# Patient Record
Sex: Male | Born: 1960
Health system: Southern US, Community
[De-identification: ages and names within clinical notes are randomized; demographics above are authoritative.]

## PROBLEM LIST (undated history)

## (undated) DIAGNOSIS — I10 Essential (primary) hypertension: Secondary | ICD-10-CM

## (undated) DIAGNOSIS — G43909 Migraine, unspecified, not intractable, without status migrainosus: Secondary | ICD-10-CM

## (undated) DIAGNOSIS — D72819 Decreased white blood cell count, unspecified: Secondary | ICD-10-CM

## (undated) DIAGNOSIS — D126 Benign neoplasm of colon, unspecified: Secondary | ICD-10-CM

## (undated) DIAGNOSIS — T7840XA Allergy, unspecified, initial encounter: Secondary | ICD-10-CM

## (undated) DIAGNOSIS — K648 Other hemorrhoids: Secondary | ICD-10-CM

## (undated) DIAGNOSIS — E785 Hyperlipidemia, unspecified: Secondary | ICD-10-CM

## (undated) DIAGNOSIS — K5790 Diverticulosis of intestine, part unspecified, without perforation or abscess without bleeding: Secondary | ICD-10-CM

## (undated) DIAGNOSIS — I2510 Atherosclerotic heart disease of native coronary artery without angina pectoris: Secondary | ICD-10-CM

## (undated) HISTORY — DX: Hyperlipidemia, unspecified: E78.5

## (undated) HISTORY — DX: Migraine, unspecified, not intractable, without status migrainosus: G43.909

## (undated) HISTORY — PX: COLONOSCOPY W/ POLYPECTOMY: SHX1380

## (undated) HISTORY — DX: Other hemorrhoids: K64.8

## (undated) HISTORY — PX: OTHER SURGICAL HISTORY: SHX169

## (undated) HISTORY — DX: Decreased white blood cell count, unspecified: D72.819

## (undated) HISTORY — DX: Essential (primary) hypertension: I10

## (undated) HISTORY — DX: Allergy, unspecified, initial encounter: T78.40XA

## (undated) HISTORY — DX: Atherosclerotic heart disease of native coronary artery without angina pectoris: I25.10

## (undated) HISTORY — PX: VASECTOMY: SHX75

## (undated) HISTORY — DX: Benign neoplasm of colon, unspecified: D12.6

## (undated) HISTORY — PX: HERNIA REPAIR: SHX51

## (undated) HISTORY — DX: Diverticulosis of intestine, part unspecified, without perforation or abscess without bleeding: K57.90

---

## 2000-09-01 ENCOUNTER — Encounter: Payer: Self-pay | Admitting: Internal Medicine

## 2000-09-01 ENCOUNTER — Ambulatory Visit (HOSPITAL_COMMUNITY): Admission: RE | Admit: 2000-09-01 | Discharge: 2000-09-01 | Payer: Self-pay | Admitting: Internal Medicine

## 2004-07-03 ENCOUNTER — Emergency Department (HOSPITAL_COMMUNITY): Admission: EM | Admit: 2004-07-03 | Discharge: 2004-07-03 | Payer: Self-pay | Admitting: Emergency Medicine

## 2004-11-27 ENCOUNTER — Ambulatory Visit: Payer: Self-pay | Admitting: Internal Medicine

## 2004-12-07 ENCOUNTER — Ambulatory Visit: Payer: Self-pay | Admitting: Internal Medicine

## 2005-07-19 HISTORY — PX: ANAL FISSURE REPAIR: SHX2312

## 2005-12-23 ENCOUNTER — Ambulatory Visit: Payer: Self-pay | Admitting: Family Medicine

## 2005-12-28 ENCOUNTER — Ambulatory Visit: Payer: Self-pay | Admitting: Internal Medicine

## 2006-01-17 ENCOUNTER — Ambulatory Visit: Payer: Self-pay | Admitting: Internal Medicine

## 2006-04-25 ENCOUNTER — Ambulatory Visit: Payer: Self-pay | Admitting: Internal Medicine

## 2006-06-02 ENCOUNTER — Ambulatory Visit: Payer: Self-pay | Admitting: Internal Medicine

## 2006-07-07 ENCOUNTER — Ambulatory Visit (HOSPITAL_BASED_OUTPATIENT_CLINIC_OR_DEPARTMENT_OTHER): Admission: RE | Admit: 2006-07-07 | Discharge: 2006-07-07 | Payer: Self-pay | Admitting: General Surgery

## 2006-08-02 ENCOUNTER — Ambulatory Visit: Payer: Self-pay | Admitting: Internal Medicine

## 2007-03-03 DIAGNOSIS — R519 Headache, unspecified: Secondary | ICD-10-CM | POA: Insufficient documentation

## 2007-03-03 DIAGNOSIS — R51 Headache: Secondary | ICD-10-CM

## 2007-06-26 ENCOUNTER — Telehealth: Payer: Self-pay | Admitting: Internal Medicine

## 2007-07-12 ENCOUNTER — Ambulatory Visit: Payer: Self-pay | Admitting: Internal Medicine

## 2007-07-12 LAB — CONVERTED CEMR LAB
ALT: 20 units/L (ref 0–53)
AST: 22 units/L (ref 0–37)
Albumin: 3.9 g/dL (ref 3.5–5.2)
Alkaline Phosphatase: 64 units/L (ref 39–117)
BUN: 12 mg/dL (ref 6–23)
Basophils Absolute: 0 10*3/uL (ref 0.0–0.1)
Basophils Relative: 0.7 % (ref 0.0–1.0)
Bilirubin Urine: NEGATIVE
Bilirubin, Direct: 0.2 mg/dL (ref 0.0–0.3)
CO2: 32 meq/L (ref 19–32)
Calcium: 9.4 mg/dL (ref 8.4–10.5)
Chloride: 105 meq/L (ref 96–112)
Cholesterol: 200 mg/dL (ref 0–200)
Creatinine, Ser: 1 mg/dL (ref 0.4–1.5)
Eosinophils Absolute: 0.2 10*3/uL (ref 0.0–0.6)
Eosinophils Relative: 3.4 % (ref 0.0–5.0)
GFR calc Af Amer: 104 mL/min
GFR calc non Af Amer: 86 mL/min
Glucose, Bld: 94 mg/dL (ref 70–99)
HCT: 42.2 % (ref 39.0–52.0)
HDL: 60.8 mg/dL (ref >39.0)
Hemoglobin, Urine: NEGATIVE
Hemoglobin: 14.6 g/dL (ref 13.0–17.0)
Ketones, ur: NEGATIVE mg/dL
LDL Cholesterol: 131 mg/dL — ABNORMAL HIGH (ref 0–99)
Leukocytes, UA: NEGATIVE
Lymphocytes Relative: 16.7 % (ref 12.0–46.0)
MCHC: 34.5 g/dL (ref 30.0–36.0)
MCV: 92.9 fL (ref 78.0–100.0)
Monocytes Absolute: 0.4 10*3/uL (ref 0.2–0.7)
Monocytes Relative: 8.4 % (ref 3.0–11.0)
Neutro Abs: 3.2 10*3/uL (ref 1.4–7.7)
Neutrophils Relative %: 70.8 % (ref 43.0–77.0)
Nitrite: NEGATIVE
PSA: 0.43 ng/mL (ref 0.10–4.00)
Platelets: 188 10*3/uL (ref 150–400)
Potassium: 4.3 meq/L (ref 3.5–5.1)
RBC: 4.55 M/uL (ref 4.22–5.81)
RDW: 12.2 % (ref 11.5–14.6)
Sodium: 142 meq/L (ref 135–145)
Specific Gravity, Urine: 1.02 (ref 1.000–1.03)
TSH: 2.11 microintl units/mL (ref 0.35–5.50)
Total Bilirubin: 0.9 mg/dL (ref 0.3–1.2)
Total CHOL/HDL Ratio: 3.3
Total Protein, Urine: NEGATIVE mg/dL
Total Protein: 7.1 g/dL (ref 6.0–8.3)
Triglycerides: 41 mg/dL (ref 0–149)
Urine Glucose: NEGATIVE mg/dL
Urobilinogen, UA: 0.2 (ref 0.0–1.0)
VLDL: 8 mg/dL (ref 0–40)
WBC: 4.6 10*3/uL (ref 4.5–10.5)
pH: 6 (ref 5.0–8.0)

## 2007-07-28 ENCOUNTER — Ambulatory Visit: Payer: Self-pay | Admitting: Internal Medicine

## 2009-01-16 ENCOUNTER — Ambulatory Visit: Payer: Self-pay | Admitting: Internal Medicine

## 2009-01-16 LAB — CONVERTED CEMR LAB
ALT: 16 units/L (ref 0–53)
AST: 24 units/L (ref 0–37)
Albumin: 3.8 g/dL (ref 3.5–5.2)
Alkaline Phosphatase: 43 units/L (ref 39–117)
BUN: 16 mg/dL (ref 6–23)
Basophils Absolute: 0 10*3/uL (ref 0.0–0.1)
Basophils Relative: 0.5 % (ref 0.0–3.0)
Bilirubin Urine: NEGATIVE
Bilirubin, Direct: 0.1 mg/dL (ref 0.0–0.3)
CO2: 31 meq/L (ref 19–32)
Calcium: 8.8 mg/dL (ref 8.4–10.5)
Chloride: 104 meq/L (ref 96–112)
Cholesterol: 191 mg/dL (ref 0–200)
Creatinine, Ser: 0.9 mg/dL (ref 0.4–1.5)
Eosinophils Absolute: 0.1 10*3/uL (ref 0.0–0.7)
Eosinophils Relative: 3.2 % (ref 0.0–5.0)
GFR calc non Af Amer: 96.06 mL/min (ref >60)
Glucose, Bld: 81 mg/dL (ref 70–99)
HCT: 41.4 % (ref 39.0–52.0)
HDL: 61.5 mg/dL (ref >39.00)
Hemoglobin, Urine: NEGATIVE
Hemoglobin: 13.9 g/dL (ref 13.0–17.0)
Ketones, ur: NEGATIVE mg/dL
LDL Cholesterol: 124 mg/dL — ABNORMAL HIGH (ref 0–99)
Leukocytes, UA: NEGATIVE
Lymphocytes Relative: 24.6 % (ref 12.0–46.0)
Lymphs Abs: 0.8 10*3/uL (ref 0.7–4.0)
MCHC: 33.6 g/dL (ref 30.0–36.0)
MCV: 94.5 fL (ref 78.0–100.0)
Monocytes Absolute: 0.3 10*3/uL (ref 0.1–1.0)
Monocytes Relative: 10.2 % (ref 3.0–12.0)
Neutro Abs: 2 10*3/uL (ref 1.4–7.7)
Neutrophils Relative %: 61.5 % (ref 43.0–77.0)
Nitrite: NEGATIVE
PSA: 0.52 ng/mL (ref 0.10–4.00)
Platelets: 151 10*3/uL (ref 150.0–400.0)
Potassium: 4.1 meq/L (ref 3.5–5.1)
RBC: 4.38 M/uL (ref 4.22–5.81)
RDW: 12.2 % (ref 11.5–14.6)
Sodium: 140 meq/L (ref 135–145)
Specific Gravity, Urine: 1.02 (ref 1.000–1.030)
TSH: 1.78 microintl units/mL (ref 0.35–5.50)
Total Bilirubin: 0.9 mg/dL (ref 0.3–1.2)
Total CHOL/HDL Ratio: 3
Total Protein: 6.4 g/dL (ref 6.0–8.3)
Triglycerides: 26 mg/dL (ref 0.0–149.0)
Urine Glucose: NEGATIVE mg/dL
Urobilinogen, UA: 0.2 (ref 0.0–1.0)
VLDL: 5.2 mg/dL (ref 0.0–40.0)
WBC: 3.2 10*3/uL — ABNORMAL LOW (ref 4.5–10.5)
pH: 6 (ref 5.0–8.0)

## 2009-02-14 ENCOUNTER — Ambulatory Visit: Payer: Self-pay | Admitting: Internal Medicine

## 2010-05-11 ENCOUNTER — Ambulatory Visit: Payer: Self-pay | Admitting: Internal Medicine

## 2010-05-11 LAB — CONVERTED CEMR LAB
ALT: 16 units/L (ref 0–53)
AST: 25 units/L (ref 0–37)
Albumin: 3.9 g/dL (ref 3.5–5.2)
Alkaline Phosphatase: 48 units/L (ref 39–117)
BUN: 14 mg/dL (ref 6–23)
Basophils Absolute: 0 10*3/uL (ref 0.0–0.1)
Basophils Relative: 1 % (ref 0.0–3.0)
Bilirubin Urine: NEGATIVE
Bilirubin, Direct: 0.1 mg/dL (ref 0.0–0.3)
Blood in Urine, dipstick: NEGATIVE
CO2: 29 meq/L (ref 19–32)
Calcium: 8.9 mg/dL (ref 8.4–10.5)
Chloride: 103 meq/L (ref 96–112)
Cholesterol: 205 mg/dL — ABNORMAL HIGH (ref 0–200)
Creatinine, Ser: 1 mg/dL (ref 0.4–1.5)
Direct LDL: 123.5 mg/dL
Eosinophils Absolute: 0.1 10*3/uL (ref 0.0–0.7)
Eosinophils Relative: 3.5 % (ref 0.0–5.0)
GFR calc non Af Amer: 85.58 mL/min (ref >60)
Glucose, Bld: 85 mg/dL (ref 70–99)
Glucose, Urine, Semiquant: NEGATIVE
HCT: 40.9 % (ref 39.0–52.0)
HDL: 60.9 mg/dL (ref >39.00)
Hemoglobin: 13.9 g/dL (ref 13.0–17.0)
Ketones, urine, test strip: NEGATIVE
Lymphocytes Relative: 23.1 % (ref 12.0–46.0)
Lymphs Abs: 0.8 10*3/uL (ref 0.7–4.0)
MCHC: 34 g/dL (ref 30.0–36.0)
MCV: 94.8 fL (ref 78.0–100.0)
Monocytes Absolute: 0.3 10*3/uL (ref 0.1–1.0)
Monocytes Relative: 8.5 % (ref 3.0–12.0)
Neutro Abs: 2.3 10*3/uL (ref 1.4–7.7)
Neutrophils Relative %: 63.9 % (ref 43.0–77.0)
Nitrite: NEGATIVE
Platelets: 159 10*3/uL (ref 150.0–400.0)
Potassium: 4.2 meq/L (ref 3.5–5.1)
Protein, U semiquant: NEGATIVE
RBC: 4.32 M/uL (ref 4.22–5.81)
RDW: 12.5 % (ref 11.5–14.6)
Sodium: 139 meq/L (ref 135–145)
Specific Gravity, Urine: 1.02
TSH: 1.3 microintl units/mL (ref 0.35–5.50)
Total Bilirubin: 0.8 mg/dL (ref 0.3–1.2)
Total CHOL/HDL Ratio: 3
Total Protein: 6.4 g/dL (ref 6.0–8.3)
Triglycerides: 37 mg/dL (ref 0.0–149.0)
Urobilinogen, UA: 0.2
VLDL: 7.4 mg/dL (ref 0.0–40.0)
WBC Urine, dipstick: NEGATIVE
WBC: 3.5 10*3/uL — ABNORMAL LOW (ref 4.5–10.5)
pH: 6.5

## 2010-05-25 ENCOUNTER — Ambulatory Visit: Payer: Self-pay | Admitting: Internal Medicine

## 2010-08-18 NOTE — Assessment & Plan Note (Signed)
Summary: CPX // RS   Vital Signs:  Patient profile:   50 year old male Height:      70 inches Weight:      168 pounds BMI:     24.19 Temp:     98.9 degrees F oral Pulse rate:   72 / minute Pulse rhythm:   regular BP sitting:   140 / 92  (left arm) Cuff size:   regular  Vitals Entered By: Alfred Levins, CMA (May 25, 2010 9:13 AM)  Serial Vital Signs/Assessments:  Time      Position  BP       Pulse  Resp  Temp     By                     135/80                         Birdie Sons MD  CC: cpx   CC:  cpx.  Current Medications (verified): 1)  Claritin-D 12 Hour 5-120 Mg  Tb12 (Loratadine-Pseudoephedrine) 2)  Ginseng 500 Mg  Tabs (Ginseng) .... Once Daily 3)  Garlique 400 Mg Tbec (Garlic) .Marland Kitchen.. 1 Once Daily 4)  Vitamin D 2000 Unit Tabs (Cholecalciferol) .Marland Kitchen.. 1 By Mouth Once Daily  Allergies (verified): No Known Drug Allergies  Social History: Occupation: banker Married Never Smoked Alcohol use-no  Physical Exam  General:  well-developed well-nourished male in no acute distress. HEENT exam atraumatic, normocephalic symmetric muscles are intact. Neck is supple without lymphadenopathy, family, jugular venous distention or carotid bruits. Chest clear to auscultation without increased work of breathing. Cardiac exam S1-S2 are regular PMI is normal. No murmurs or gallops. Abdominal exam thin, to bowel sounds, soft. Extremities there is no clubbing cyanosis or edema pedal pulses are normal. Posterior tibial pulses normal.   Impression & Recommendations:  Problem # 1:  ROUTINE GENERAL MEDICAL EXAM@HEALTH  CARE FACL (ICD-V70.0) health maint UTD patient is in excellent health. He is encouraged to continue his excellent health habits. He exercises regularly.  Complete Medication List: 1)  Claritin-d 12 Hour 5-120 Mg Tb12 (Loratadine-pseudoephedrine) 2)  Ginseng 500 Mg Tabs (Ginseng) .... Once daily 3)  Garlique 400 Mg Tbec (Garlic) .Marland Kitchen.. 1 once daily 4)  Vitamin D 2000 Unit  Tabs (Cholecalciferol) .Marland Kitchen.. 1 by mouth once daily    Orders Added: 1)  Est. Patient 40-64 years [[id number]]

## 2010-12-04 NOTE — Op Note (Signed)
NAME:  Matthew Fox, Matthew Fox NO.:  [id number]   MEDICAL RECORD NO.:  [id number]          PATIENT TYPE:  AMB   LOCATION:  DSC                          FACILITY:  [facility]   PHYSICIAN:  Sharlet Salina T. Hoxworth, M.D.DATE OF BIRTH:  1961-03-03   DATE OF PROCEDURE:  07/07/2006  DATE OF DISCHARGE:                               OPERATIVE REPORT   PREOPERATIVE DIAGNOSIS:  Fistula in ano.   POSTOPERATIVE DIAGNOSIS:  Fistula in ano.   SURGICAL PROCEDURES:  Anal fistulotomy.   SURGEON:  Lorne Skeens. Hoxworth, M.D.   ANESTHESIA:  General.   BRIEF HISTORY:  [name] is a 45-year male who presents with  a several year history of intermittent pressure, pain and perianal  drainage.  More recently, he has had more severe pain with bowel  movements.  Examination in the office has revealed an anal fistula with  an external opening anteriorly at about the 1 o'clock position near the  anal verge.  Also noted was a fairly superficial posterior midline  fissure.  I recommended proceeding with examine under anesthesia with  anal fistulotomy and possible lateral anal sphincterotomy depending on  findings.  The patient was given diltiazem cream preoperatively and  actually has had significant improvement in his more acute pain post  bowel movements.  The nature of procedures, indications, risks of  bleeding, infection, recurrence and degrees of incontinence were  discussed and understood.  The patient was now brought to the operating  room for this procedure.   DESCRIPTION OF OPERATION:  The patient was brought to the operating room  and placed in the supine position on the table, and general laryngeal  mask anesthesia was induced.  He was then carefully positioned in  lithotomy position, the perineum sterilely prepped and draped.  The anus  was gently dilated and then carefully inspected and examined.  There was  a quite small superficial posterior midline fissure.  There did seem  to  be some mild tightness of the internal anal sphincter.  There was a  typical appearing external fistula opening at the 1 o'clock position  about a centimeter and a half of the anal verge.  Initially, I had some  difficulty finding a fistula tract with a probe through the external  opening.  The external opening was excised, and then there was seen to  be a tract.  The probe passed easily to an internal opening in the  anterior midline.  The tissue over the top the probe was then divided  with cautery.  This appeared to diverse a small superficial portion of  the external anal sphincter.  The fistula was completely divided.  Hemostasis was obtained with the cautery.  At this point, as I did have  to divide a small portion  of the external sphincter and the fissure appeared to be healing with  medical management, I elected not to perform an internal sphincterotomy  at this time.  The internal anal sphincter was gently dilated.  The  operative site was packed for hemostasis and dry gauze dressing applied.  The patient taken to the recovery room in  good condition.      Lorne Skeens. Hoxworth, M.D.  Electronically Signed     BTH/MEDQ  D:  07/07/2006  T:  07/07/2006  Job:  [id number]

## 2010-12-04 NOTE — Assessment & Plan Note (Signed)
[facility]                           GASTROENTEROLOGY OFFICE NOTE   NAME:Matthew Fox, Matthew Fox                   MRN:          [id number]  DATE:06/02/2006                            DOB:          03-22-1961    CHIEF COMPLAINT:  Rectal pain.   HISTORY:  [age]-year-old white man that has had rectal pain for about 2  or 3 months.  It seems to be more on the right side of his buttock.  Sometimes he has some pain down the leg, but no back pain.  No numbness and  tingling.  His bowel movements are somewhat soft, but this has not changed;  about once a day he goes to the bathroom.  Again, no rectal bleeding.  Sitting can cause his problems for him when this happens.  He had tried some  hemorrhoidal cream and some suppositories without any benefit, as prescribed  by Dr. Cato Mulligan.  He also has a history of about 5 years ago of having some  intense rectal pain when he had some other illnesses and then one day he  noted a milky discharge in his underwear and the symptoms resolved.  He has  not had any discharge this time.   PAST MEDICAL HISTORY:  Otherwise negative.   MEDICATIONS:  Multivitamin and ginseng daily, Advil p.r.n., which does help  his pain somewhat.   DRUG ALLERGIES:  None known.   FAMILY HISTORY:  A maternal grandfather had colon cancer, no other relatives  with that.   SOCIAL HISTORY:  He is married.  He is a Psychologist, occupational at [employer].  Two  daughters.  Uses some alcohol, but no tobacco or drugs.   REVIEW OF SYSTEMS:  Otherwise negative.  He is very active as a runner,  logging 25-30 miles a week.   PHYSICAL EXAMINATION:  Physical exam reveals a well-developed, well-  nourished middle-aged white man appearing younger than his stated age.  Blood pressure 119/74.  Weight 162 pounds.  Pulse 64.  HEENT:  EOMI.  PERRLA.  Sclerae are anicteric.  Conjunctivae are pink.  NECK:  Supple without thyromegaly, adenopathy or carotid bruits.  CHEST:  Clear to auscultation and percussion without adventitious sounds.  CARDIAC:  Regular rhythm; normal S1, S2.  There are no murmurs, gallops or  rubs.  ABDOMEN:  Soft and nontender.  No organomegaly or mass.  LYMPH NODES:  No groin adenopathy.  SKIN:  Warm and dry without rash.  RECTAL:  Exam does not really show a sentinel pile, but there are some minor  protrusions from the anus consistent with possible old hemorrhoids.  There  is no perianal abscess or fluctuance detected.  He is not really tender on  the perianal exam.  The ischial tuberosities are not tender.  Upon attempt  at rectal exam, he is exquisitely tender in the posterior aspect of the  anus.  Even with 5th digit exam, I cannot really insert my finger due to the  pain.  EXTREMITIES:  Full range of motion.  No cyanosis, clubbing or edema.  RECTAL:  There are no masses.  Stool is Hemoccult-negative.  ASSESSMENT:  This seems most likely an anal fissure.  Abscess is possible,  but I would have expected some drainage.  His bowel movements are soft.  He  does not really describe a lot of pain with defecation, but again, the  clinical scenario is most compatible with a fissure.  I am not sure why he  has had some pain down the leg, then it could just be some radiation from  this pain.  I do not think the has got neuropathic problems like sciatica,  particularly in light of the rectal exam.   PLAN:  1. Diltiazem gel 2% twice daily and before bowel movements.  2. Sitz baths, anal fissure handout given to the patient.  3. Return to see me in 6 weeks.  4. If he is not noting improvement in 2 weeks, he is to call me and I will      arrange for a surgical evaluation.  5. Consider colonoscopy screening, given the family history of colon      cancer, although it is in one 2nd-degree relative.     Iva Boop, MD,FACG  Electronically Signed    CEG/MedQ  DD: 06/02/2006  DT: 06/02/2006  Job #: [id number]   cc:   Valetta Mole.  Swords, MD

## 2011-09-02 ENCOUNTER — Other Ambulatory Visit: Payer: Self-pay | Admitting: Dermatology

## 2011-11-03 ENCOUNTER — Other Ambulatory Visit (INDEPENDENT_AMBULATORY_CARE_PROVIDER_SITE_OTHER): Payer: BC Managed Care – PPO

## 2011-11-03 DIAGNOSIS — Z0000 Encounter for general adult medical examination without abnormal findings: Secondary | ICD-10-CM

## 2011-11-03 LAB — CBC WITH DIFFERENTIAL/PLATELET
Basophils Absolute: 0 10*3/uL (ref 0.0–0.1)
Eosinophils Relative: 3.3 % (ref 0.0–5.0)
HCT: 41.5 % (ref 39.0–52.0)
Hemoglobin: 14 g/dL (ref 13.0–17.0)
Lymphs Abs: 0.8 10*3/uL (ref 0.7–4.0)
MCV: 93.5 fl (ref 78.0–100.0)
Monocytes Absolute: 0.3 10*3/uL (ref 0.1–1.0)
Neutro Abs: 2 10*3/uL (ref 1.4–7.7)
Platelets: 156 10*3/uL (ref 150.0–400.0)
RDW: 13.1 % (ref 11.5–14.6)

## 2011-11-03 LAB — POCT URINALYSIS DIPSTICK
Bilirubin, UA: NEGATIVE
Blood, UA: NEGATIVE
Ketones, UA: NEGATIVE
Leukocytes, UA: NEGATIVE
Protein, UA: NEGATIVE
pH, UA: 7

## 2011-11-03 LAB — BASIC METABOLIC PANEL
BUN: 13 mg/dL (ref 6–23)
CO2: 26 mEq/L (ref 19–32)
Calcium: 9 mg/dL (ref 8.4–10.5)
Chloride: 104 mEq/L (ref 96–112)
Creatinine, Ser: 1 mg/dL (ref 0.4–1.5)
GFR: 86.06 mL/min (ref >60.00)
Glucose, Bld: 94 mg/dL (ref 70–99)
Potassium: 4.2 mEq/L (ref 3.5–5.1)
Sodium: 138 mEq/L (ref 135–145)

## 2011-11-03 LAB — LIPID PANEL
Cholesterol: 204 mg/dL — ABNORMAL HIGH (ref 0–200)
Total CHOL/HDL Ratio: 3
Triglycerides: 29 mg/dL (ref 0.0–149.0)
VLDL: 5.8 mg/dL (ref 0.0–40.0)

## 2011-11-03 LAB — HEPATIC FUNCTION PANEL
ALT: 17 U/L (ref 0–53)
AST: 25 U/L (ref 0–37)
Albumin: 4 g/dL (ref 3.5–5.2)
Alkaline Phosphatase: 46 U/L (ref 39–117)
Bilirubin, Direct: 0.1 mg/dL (ref 0.0–0.3)
Total Bilirubin: 0.7 mg/dL (ref 0.3–1.2)
Total Protein: 6.5 g/dL (ref 6.0–8.3)

## 2011-11-03 LAB — LDL CHOLESTEROL, DIRECT: Direct LDL: 125.5 mg/dL

## 2011-11-30 ENCOUNTER — Ambulatory Visit (INDEPENDENT_AMBULATORY_CARE_PROVIDER_SITE_OTHER): Payer: BC Managed Care – PPO | Admitting: Internal Medicine

## 2011-11-30 ENCOUNTER — Encounter: Payer: Self-pay | Admitting: Internal Medicine

## 2011-11-30 VITALS — BP 130/76 | HR 60 | Temp 98.2°F | Ht 70.0 in | Wt 164.0 lb

## 2011-11-30 DIAGNOSIS — Z0000 Encounter for general adult medical examination without abnormal findings: Secondary | ICD-10-CM

## 2011-11-30 NOTE — Progress Notes (Signed)
Patient ID: Matthew Fox, male   DOB: Apr 27, 1961, 51 y.o.   MRN: [id number] CPX  Past Medical History  Diagnosis Date  . Thrombocytopenia   . Hyperlipidemia   . Headache     History   Social History  . Marital Status: Married    Spouse Name: N/A    Number of Children: N/A  . Years of Education: N/A   Occupational History  . Not on file.   Social History Main Topics  . Smoking status: Never Smoker   . Smokeless tobacco: Not on file  . Alcohol Use: No  . Drug Use: No  . Sexually Active: Not on file   Other Topics Concern  . Not on file   Social History Narrative  . No narrative on file    Past Surgical History  Procedure Date  . Hernia repair     Family History  Problem Relation Age of Onset  . Cancer Mother     lung  . Heart failure Father   . Cancer Maternal Grandfather     colon    No Known Allergies  No current outpatient prescriptions on file prior to visit.     patient denies chest pain, shortness of breath, orthopnea. Denies lower extremity edema, abdominal pain, change in appetite, change in bowel movements. Patient denies rashes, musculoskeletal complaints. No other specific complaints in a complete review of systems.   BP 130/92  Pulse 60  Temp(Src) 98.2 F (36.8 C) (Oral)  Ht 5\' 10"  (1.778 m)  Wt 164 lb (74.39 kg)  BMI 23.53 kg/m2 Well-developed male in no acute distress. HEENT exam atraumatic, normocephalic, extraocular muscles are intact. Conjunctivae are pink without exudate. Neck is supple without lymphadenopathy, thyromegaly, jugular venous distention. Chest is clear to auscultation without increased work of breathing. Cardiac exam S1-S2 are regular. The PMI is normal. No significant murmurs or gallops. Abdominal exam active bowel sounds, soft, nontender. No abdominal bruits. Extremities no clubbing cyanosis or edema. Peripheral pulses are normal without bruits. Neurologic exam alert and oriented without any motor or sensory deficits.  Rectal exam normal tone prostate normal size without masses or asymmetry.   Well Visit: health maintenance up to date.

## 2012-02-04 ENCOUNTER — Encounter: Payer: Self-pay | Admitting: Gastroenterology

## 2012-02-29 ENCOUNTER — Encounter: Payer: Self-pay | Admitting: Internal Medicine

## 2012-04-17 ENCOUNTER — Ambulatory Visit (AMBULATORY_SURGERY_CENTER): Payer: BC Managed Care – PPO | Admitting: *Deleted

## 2012-04-17 ENCOUNTER — Encounter: Payer: Self-pay | Admitting: Internal Medicine

## 2012-04-17 VITALS — Ht 70.0 in | Wt 165.0 lb

## 2012-04-17 DIAGNOSIS — Z1211 Encounter for screening for malignant neoplasm of colon: Secondary | ICD-10-CM

## 2012-04-17 MED ORDER — NA SULFATE-K SULFATE-MG SULF 17.5-3.13-1.6 GM/177ML PO SOLN: ORAL | Status: DC

## 2012-05-01 ENCOUNTER — Ambulatory Visit (AMBULATORY_SURGERY_CENTER): Payer: BC Managed Care – PPO | Admitting: Internal Medicine

## 2012-05-01 ENCOUNTER — Encounter: Payer: Self-pay | Admitting: Internal Medicine

## 2012-05-01 VITALS — BP 112/63 | HR 51 | Temp 96.1°F | Resp 21 | Ht 70.0 in | Wt 165.0 lb

## 2012-05-01 DIAGNOSIS — D126 Benign neoplasm of colon, unspecified: Secondary | ICD-10-CM

## 2012-05-01 DIAGNOSIS — Z1211 Encounter for screening for malignant neoplasm of colon: Secondary | ICD-10-CM

## 2012-05-01 MED ORDER — SODIUM CHLORIDE 0.9 % IV SOLN: 500.0000 mL | INTRAVENOUS | Status: DC

## 2012-05-01 NOTE — Progress Notes (Addendum)
Patient did not have preoperative order for IV antibiotic SSI prophylaxis. (G8918)  Patient did not experience any of the following events: a burn prior to discharge; a fall within the facility; wrong site/side/patient/procedure/implant event; or a hospital transfer or hospital admission upon discharge from the facility. (G8907)  

## 2012-05-01 NOTE — Progress Notes (Signed)
The pt tolerated the colonoscopy very well. Maw   

## 2012-05-01 NOTE — Patient Instructions (Addendum)
YOU HAD AN ENDOSCOPIC PROCEDURE TODAY AT THE [facility]: Refer to the procedure report that was given to you for any specific questions about what was found during the examination.  If the procedure report does not answer your questions, please call your gastroenterologist to clarify.  If you requested that your care partner not be given the details of your procedure findings, then the procedure report has been included in a sealed envelope for you to review at your convenience later.  YOU SHOULD EXPECT: Some feelings of bloating in the abdomen. Passage of more gas than usual.  Walking can help get rid of the air that was put into your GI tract during the procedure and reduce the bloating. If you had a lower endoscopy (such as a colonoscopy or flexible sigmoidoscopy) you may notice spotting of blood in your stool or on the toilet paper. If you underwent a bowel prep for your procedure, then you may not have a normal bowel movement for a few days.  DIET: Your first meal following the procedure should be a light meal and then it is ok to progress to your normal diet.  A half-sandwich or bowl of soup is an example of a good first meal.  Heavy or fried foods are harder to digest and may make you feel nauseous or bloated.  Likewise meals heavy in dairy and vegetables can cause extra gas to form and this can also increase the bloating.  Drink plenty of fluids but you should avoid alcoholic beverages for 24 hours.  ACTIVITY: Your care partner should take you home directly after the procedure.  You should plan to take it easy, moving slowly for the rest of the day.  You can resume normal activity the day after the procedure however you should NOT DRIVE or use heavy machinery for 24 hours (because of the sedation medicines used during the test).    SYMPTOMS TO REPORT IMMEDIATELY: A gastroenterologist can be reached at any hour.  During normal business hours, 8:30 AM to 5:00 PM Monday through Friday,  call [phone].  After hours and on weekends, please call the GI answering service at [phone] who will take a message and have the physician on call contact you.   Following lower endoscopy (colonoscopy or flexible sigmoidoscopy):  Excessive amounts of blood in the stool  Significant tenderness or worsening of abdominal pains  Swelling of the abdomen that is new, acute  Fever of 100F or higher  FOLLOW UP: If any biopsies were taken you will be contacted by phone or by letter within the next 1-3 weeks.  Call your gastroenterologist if you have not heard about the biopsies in 3 weeks.  Our staff will call the home number listed on your records the next business day following your procedure to check on you and address any questions or concerns that you may have at that time regarding the information given to you following your procedure. This is a courtesy call and so if there is no answer at the home number and we have not heard from you through the emergency physician on call, we will assume that you have returned to your regular daily activities without incident.  SIGNATURES/CONFIDENTIALITY: You and/or your care partner have signed paperwork which will be entered into your electronic medical record.  These signatures attest to the fact that that the information above on your After Visit Summary has been reviewed and is understood.  Full responsibility of the confidentiality of this   discharge information lies with you and/or your care-partner.   Thank-you for choosing us for your medical needs. 

## 2012-05-01 NOTE — Op Note (Signed)
[facility] [address] [place], [address]   COLONOSCOPY PROCEDURE REPORT  PATIENT: Matthew Fox, Matthew Fox  MR#: [id number] BIRTHDATE: [birth year] , 51  yrs. old GENDER: Male ENDOSCOPIST: Beverley Fiedler, MD REFERRED XB:JYNWGN, Bruce PROCEDURE DATE:  05/01/2012 PROCEDURE:   Colonoscopy with snare polypectomy ASA CLASS:   Class I INDICATIONS:average risk screening and first colonoscopy. MEDICATIONS: MAC sedation, administered by CRNA and Propofol (Diprivan) 370 mg IV  DESCRIPTION OF PROCEDURE:   After the risks benefits and alternatives of the procedure were thoroughly explained, informed consent was obtained.  A digital rectal exam revealed no rectal mass.   The     endoscope was introduced through the anus and advanced to the cecum, which was identified by both the appendix and ileocecal valve. No adverse events experienced.   The quality of the prep was Suprep fair  The instrument was then slowly withdrawn as the colon was fully examined.   COLON FINDINGS: Two sessile polyps measuring 4-6 mm in size were found in the ascending colon and sigmoid colon.  Polypectomy was performed using cold snare.  All resections were complete and all polyp tissue was completely retrieved.   Mild diverticulosis was noted in the ascending colon and sigmoid colon.  Retroflexed views revealed no abnormalities. The time to cecum=4 minutes 48 seconds. Withdrawal time=15 minutes 20 seconds.  The scope was withdrawn and the procedure completed. COMPLICATIONS: There were no complications.  ENDOSCOPIC IMPRESSION: 1.   Two sessile polyps measuring 4-6 mm in size were found in the ascending colon and sigmoid colon; Polypectomy was performed using cold snare 2.   Mild diverticulosis was noted in the ascending colon and sigmoid colon  RECOMMENDATIONS: 1.  Await pathology results 2.  If the polyps removed today are proven to be adenomatous (pre-cancerous) polyps, you will need a repeat  colonoscopy in 5 years.  Otherwise you should continue to follow colorectal cancer screening guidelines for "routine risk" patients with colonoscopy in 10 years.  You will receive a letter within 1-2 weeks with the results of your biopsy as well as final recommendations.  Please call my office if you have not received a letter after 3 weeks.   eSigned:  Beverley Fiedler, MD 05/01/2012 8:48 AM   cc: Lindley Magnus, MD and The Patient

## 2012-05-02 ENCOUNTER — Telehealth: Payer: Self-pay | Admitting: *Deleted

## 2012-05-02 NOTE — Telephone Encounter (Signed)
No answer, identifier. Left message to call if questions or concerns.

## 2012-05-02 NOTE — Telephone Encounter (Deleted)
  Follow up Call-  Call back number 05/01/2012  Post procedure Call Back phone  # [phone]  Permission to leave phone message Yes     Patient questions:  Do you have a fever, pain , or abdominal swelling? {yes no:314532} Pain Score  {NUMBERS; 0-10:5044} *  Have you tolerated food without any problems? {yes no:314532}  Have you been able to return to your normal activities? {yes no:314532}  Do you have any questions about your discharge instructions: Diet   {yes no:314532} Medications  {yes no:314532} Follow up visit  {yes no:314532}  Do you have questions or concerns about your Care? {yes no:314532}  Actions: * If pain score is 4 or above: {ACTION; [facility] ENDO PAIN >4:21563::"No action needed, pain <4."}

## 2012-05-08 ENCOUNTER — Encounter: Payer: Self-pay | Admitting: Internal Medicine

## 2013-05-23 ENCOUNTER — Other Ambulatory Visit (INDEPENDENT_AMBULATORY_CARE_PROVIDER_SITE_OTHER): Payer: BC Managed Care – PPO

## 2013-05-23 DIAGNOSIS — Z0000 Encounter for general adult medical examination without abnormal findings: Secondary | ICD-10-CM

## 2013-05-23 LAB — HEPATIC FUNCTION PANEL
ALT: 20 U/L (ref 0–53)
Albumin: 4 g/dL (ref 3.5–5.2)
Bilirubin, Direct: 0.1 mg/dL (ref 0.0–0.3)
Total Bilirubin: 0.8 mg/dL (ref 0.3–1.2)

## 2013-05-23 LAB — CBC WITH DIFFERENTIAL/PLATELET
Basophils Absolute: 0 10*3/uL (ref 0.0–0.1)
Basophils Relative: 0.8 % (ref 0.0–3.0)
Eosinophils Absolute: 0.1 10*3/uL (ref 0.0–0.7)
MCHC: 34.9 g/dL (ref 30.0–36.0)
MCV: 91 fl (ref 78.0–100.0)
Monocytes Absolute: 0.3 10*3/uL (ref 0.1–1.0)
Neutrophils Relative %: 73.9 % (ref 43.0–77.0)
Platelets: 161 10*3/uL (ref 150.0–400.0)
RBC: 4.36 Mil/uL (ref 4.22–5.81)
RDW: 13.4 % (ref 11.5–14.6)

## 2013-05-23 LAB — POCT URINALYSIS DIPSTICK
Bilirubin, UA: NEGATIVE
Ketones, UA: NEGATIVE
Leukocytes, UA: NEGATIVE
pH, UA: 6.5

## 2013-05-23 LAB — BASIC METABOLIC PANEL
BUN: 14 mg/dL (ref 6–23)
CO2: 27 mEq/L (ref 19–32)
Chloride: 106 mEq/L (ref 96–112)
Creatinine, Ser: 0.9 mg/dL (ref 0.4–1.5)
Glucose, Bld: 91 mg/dL (ref 70–99)

## 2013-05-23 LAB — TSH: TSH: 1.97 u[IU]/mL (ref 0.35–5.50)

## 2013-05-23 LAB — PSA: PSA: 0.45 ng/mL (ref 0.10–4.00)

## 2013-05-23 LAB — LIPID PANEL: Triglycerides: 28 mg/dL (ref 0.0–149.0)

## 2013-05-23 LAB — LDL CHOLESTEROL, DIRECT: Direct LDL: 132.8 mg/dL

## 2013-05-30 ENCOUNTER — Encounter: Payer: BC Managed Care – PPO | Admitting: Internal Medicine

## 2013-06-12 ENCOUNTER — Encounter: Payer: Self-pay | Admitting: Internal Medicine

## 2013-06-12 ENCOUNTER — Ambulatory Visit (INDEPENDENT_AMBULATORY_CARE_PROVIDER_SITE_OTHER): Payer: BC Managed Care – PPO | Admitting: Internal Medicine

## 2013-06-12 VITALS — BP 116/78 | HR 60 | Temp 98.0°F | Ht 70.0 in | Wt 165.0 lb

## 2013-06-12 DIAGNOSIS — Z0000 Encounter for general adult medical examination without abnormal findings: Secondary | ICD-10-CM

## 2013-06-12 MED ORDER — ELETRIPTAN HYDROBROMIDE 40 MG PO TABS: 40.0000 mg | ORAL_TABLET | ORAL | Status: DC | PRN

## 2013-06-12 NOTE — Progress Notes (Signed)
cpx  Past Medical History  Diagnosis Date  . Thrombocytopenia   . Hyperlipidemia   . Headache   . Allergy     History   Social History  . Marital Status: Married    Spouse Name: N/A    Number of Children: N/A  . Years of Education: N/A   Occupational History  . Not on file.   Social History Main Topics  . Smoking status: Never Smoker   . Smokeless tobacco: Never Used  . Alcohol Use: No  . Drug Use: No  . Sexual Activity: Not on file   Other Topics Concern  . Not on file   Social History Narrative  . No narrative on file    Past Surgical History  Procedure Laterality Date  . Hernia repair      as infant; bilateral  . Anal fissure repair  2007    Family History  Problem Relation Age of Onset  . Cancer Mother     lung  . Heart failure Father   . Cancer Maternal Grandfather     colon  . Colon cancer Maternal Grandfather 27  . Stomach cancer Neg Hx     No Known Allergies  Current Outpatient Prescriptions on File Prior to Visit  Medication Sig Dispense Refill  . Ginseng 500 MG TABS Take 1 tablet by mouth daily.      Marland Kitchen loratadine-pseudoephedrine (CLARITIN-D 12-HOUR) 5-120 MG per tablet Take 1 tablet by mouth daily.       No current facility-administered medications on file prior to visit.     patient denies chest pain, shortness of breath, orthopnea. Denies lower extremity edema, abdominal pain, change in appetite, change in bowel movements. Patient denies rashes, musculoskeletal complaints. No other specific complaints in a complete review of systems. - Headache- typically happens at the end of the week. Ongoing for years. Can be unilateral , can be associated with nausea. Occasionally can be bilateral  Reviewed vitals Well-developed male in no acute distress. HEENT exam atraumatic, normocephalic, extraocular muscles are intact. Conjunctivae are pink without exudate. Neck is supple without lymphadenopathy, thyromegaly, jugular venous distention. Chest is  clear to auscultation without increased work of breathing. Cardiac exam S1-S2 are regular. The PMI is normal. No significant murmurs or gallops. Abdominal exam active bowel sounds, soft, nontender. No abdominal bruits. Extremities no clubbing cyanosis or edema. Peripheral pulses are normal without bruits. Neurologic exam alert and oriented without any motor or sensory deficits. Rectal exam normal tone prostate normal size without masses or asymmetry.   Well Visit- health maint UTD

## 2013-06-12 NOTE — Progress Notes (Signed)
Pre visit review using our clinic review tool, if applicable. No additional management support is needed unless otherwise documented below in the visit note. 

## 2013-06-15 ENCOUNTER — Telehealth: Payer: Self-pay | Admitting: Internal Medicine

## 2013-06-15 MED ORDER — ELETRIPTAN HYDROBROMIDE 40 MG PO TABS: 40.0000 mg | ORAL_TABLET | ORAL | Status: DC | PRN

## 2013-06-15 NOTE — Telephone Encounter (Signed)
Left detailed message of personal voicemail Rx sent to pharmacy as requested.

## 2013-06-15 NOTE — Telephone Encounter (Signed)
Pt wants to Dr. Cato Mulligan to know that the relpax does work and he is very happy.  Please call in a new rx of this medication to [facility]. Please advise.

## 2013-06-15 NOTE — Telephone Encounter (Signed)
FYI Dr. Cato Mulligan pt wanted you to know the Relpax worked and I sent Rx to pharmacy.

## 2013-06-20 ENCOUNTER — Telehealth: Payer: Self-pay | Admitting: Internal Medicine

## 2013-06-20 MED ORDER — ELETRIPTAN HYDROBROMIDE 40 MG PO TABS: ORAL_TABLET | ORAL | Status: DC

## 2013-06-20 NOTE — Telephone Encounter (Signed)
Pt states he was supposed to have rx for eletriptan (RELPAX) 40 MG tablet sent to the pharmacy last week.  Pt just left pharmacy and was told no rx was sent in.  Please review and follow up with pt.

## 2013-06-20 NOTE — Telephone Encounter (Signed)
Reviewed chart and rx was printed by accident.  Resubmitted electronically

## 2013-09-28 ENCOUNTER — Other Ambulatory Visit: Payer: Self-pay | Admitting: Internal Medicine

## 2014-01-17 ENCOUNTER — Other Ambulatory Visit: Payer: Self-pay | Admitting: Internal Medicine

## 2014-04-01 ENCOUNTER — Other Ambulatory Visit: Payer: Self-pay | Admitting: Internal Medicine

## 2014-07-06 ENCOUNTER — Other Ambulatory Visit: Payer: Self-pay | Admitting: Internal Medicine

## 2014-11-12 ENCOUNTER — Ambulatory Visit (INDEPENDENT_AMBULATORY_CARE_PROVIDER_SITE_OTHER): Payer: BLUE CROSS/BLUE SHIELD | Admitting: Family Medicine

## 2014-11-12 ENCOUNTER — Encounter: Payer: Self-pay | Admitting: Family Medicine

## 2014-11-12 VITALS — BP 112/80 | HR 66 | Temp 98.3°F | Ht 72.0 in | Wt 166.0 lb

## 2014-11-12 DIAGNOSIS — D72819 Decreased white blood cell count, unspecified: Secondary | ICD-10-CM | POA: Insufficient documentation

## 2014-11-12 DIAGNOSIS — G43909 Migraine, unspecified, not intractable, without status migrainosus: Secondary | ICD-10-CM

## 2014-11-12 DIAGNOSIS — D126 Benign neoplasm of colon, unspecified: Secondary | ICD-10-CM | POA: Insufficient documentation

## 2014-11-12 DIAGNOSIS — E785 Hyperlipidemia, unspecified: Secondary | ICD-10-CM | POA: Insufficient documentation

## 2014-11-12 NOTE — Progress Notes (Signed)
Matthew Reddish, MD Phone: [phone]  Subjective:  Patient presents today to establish care with me as their new primary care provider. Patient was formerly a patient of Dr. Leanne Chang. Chief complaint-noted.   Migraine- no prophylaxis, reasonable control with abortive therapy -since teenage years. Usually towards the end of the week. Any # of triggers-food, red wine, fatigue, allergies flared up so taking claritin D 3x a week. Severe in right temple. Can usually get rid of them with tylenol but has had to take up to 12 a day in past. Relpax Also relieves headaches. Nausea with episodes. Pounding. Not light or sound sensitive. Averages around once a month. Does better when he is exercising which he generally does a good job of-Running 15 miles a week and biking road some.  ROS- denies aura, blurry vision, increasing frequency or intensity of headaches.   Hyperlipidemia- mild poor control. 2.7% 10 year risk  Lab Results  Component Value Date   LDLCALC 124* 01/16/2009   On statin: no Regular exercise: yes, avid runner Diet: very reasonable ROS- no chest pain or shortness of breath. No myalgias  The following were reviewed and entered/updated in epic: Past Medical History  Diagnosis Date  . Leukopenia     stable in the 3s  . Hyperlipidemia     no rx. total just above 200, HDL great, LDL in 120s 130s  . Migraine     relpax about once a month  . Allergy   . Adenomatous colon polyp     2013, 5 year repeat   Patient Active Problem List   Diagnosis Date Noted  . Migraine     Priority: Medium  . Hyperlipidemia     Priority: Medium  . Adenomatous colon polyp     Priority: Low  . Leukopenia     Priority: Low   Past Surgical History  Procedure Laterality Date  . Hernia repair      as infant; bilateral  . Anal fissure repair  2007    Family History  Problem Relation Age of Onset  . Lung cancer Mother     smoker, died 77 age 55  . Heart failure Father     rheumatic  fever-died 23 months after mother, did not take care of himself  . Colon cancer Maternal Grandfather     early 16s  . Stomach cancer Neg Hx     Medications- reviewed and updated Current Outpatient Prescriptions  Medication Sig Dispense Refill  . Ginseng 500 MG TABS Take 1 tablet by mouth daily.    Marland Kitchen loratadine-pseudoephedrine (CLARITIN-D 12-HOUR) 5-120 MG per tablet Take 1 tablet by mouth daily.    . RELPAX 40 MG tablet TAKE 1 TABLET BY MOUTH AT ONSET OF HEADACHE( MAY REPEAT IN 2 HOURS IF HEADACHE STILL PERSISTS) 10 tablet 3   No current facility-administered medications for this visit.    Allergies-reviewed and updated No Known Allergies  History   Social History  . Marital Status: Married    Spouse Name: N/A  . Number of Children: N/A  . Years of Education: N/A   Social History Main Topics  . Smoking status: Never Smoker   . Smokeless tobacco: Never Used  . Alcohol Use: 4.2 oz/week    7 Standard drinks or equivalent per week  . Drug Use: No  . Sexual Activity: Not on file   Other Topics Concern  . None   Social History Narrative   Married (wife patient of Dr. Yong Channel), 2 daughters Janett Billow 79 NP  in [place] with oncology and Lilia Pro 20 sophomore at [employer] going into Business.       Works at [employer]: running, biking, golf    ROS--See HPI   Objective: BP 112/80 mmHg  Pulse 66  Temp(Src) 98.3 F (36.8 C)  Ht 6' (1.829 m)  Wt 166 lb (75.297 kg)  BMI 22.51 kg/m2 Gen: NAD, resting comfortably HEENT: Mucous membranes are moist. Oropharynx normal Neck: no thyromegaly CV: RRR no murmurs rubs or gallops Lungs: CTAB no crackles, wheeze, rhonchi Abdomen: soft/nontender/nondistended/normal bowel sounds. No rebound or guarding.  Ext: no edema, 2+ PT pulses Skin: warm, dry, no rash Neuro: grossly normal, moves all extremities, PERRLA   Assessment/Plan:  Migraine Doing well on relpax but insurance no longer covers. 2 remaining samples  we had were provided to patient. We will research to see if any triptans covered other than relpax.    Hyperlipidemia 2.7% 10 year risk today using 2014 labs and today's vitals. Patient will return for physical this year and we will update but I suspect patient risks will not outweigh benefits for statin. Continue regular running, healthy eating.    6-12 months CPE

## 2014-11-12 NOTE — Assessment & Plan Note (Signed)
2.7% 10 year risk today using 2014 labs and today's vitals. Patient will return for physical this year and we will update but I suspect patient risks will not outweigh benefits for statin. Continue regular running, healthy eating.

## 2014-11-12 NOTE — Patient Instructions (Signed)
We will try to reach out to your insurance and see which options are covered for migraine treatment. Provided 2 samples. If you dont hear from [place] in a month, give [place] a call so we can update your status.   Follow up at your convenience for annual physical with labs a few days to a week before including PSAh

## 2014-11-12 NOTE — Progress Notes (Signed)
Pre visit review using our clinic review tool, if applicable. No additional management support is needed unless otherwise documented below in the visit note. 

## 2014-11-12 NOTE — Assessment & Plan Note (Signed)
Doing well on relpax but insurance no longer covers. 2 remaining samples we had were provided to patient. We will research to see if any triptans covered other than relpax.

## 2014-11-13 ENCOUNTER — Telehealth: Payer: Self-pay | Admitting: Family Medicine

## 2014-11-13 MED ORDER — SUMATRIPTAN SUCCINATE 50 MG PO TABS: 50.0000 mg | ORAL_TABLET | ORAL | Status: DC | PRN

## 2014-11-13 NOTE — Progress Notes (Signed)
I left a print out from [employer]'s formulary for you to review.

## 2014-11-13 NOTE — Telephone Encounter (Signed)
I received a copy of formulary which states relpax 40mg  is tier 2 so not clear why it is not covered.   Says sumatriptan (imitrex)is tier 1 so we will trial that medication.   Please ask patient to stop by or call [facility] to ask for price before filling. On epic, says may need prior auth, so not clear what steps we would need to use if this not covered. Will CC Latonya to keep her in the look if we hear back that not covered

## 2014-11-14 NOTE — Telephone Encounter (Signed)
Thank you, noted.

## 2014-11-14 NOTE — Telephone Encounter (Signed)
I checked with the pharmacy and sumatriptan did not require a PA. It is ready for patient for pick up.

## 2015-02-14 ENCOUNTER — Other Ambulatory Visit (INDEPENDENT_AMBULATORY_CARE_PROVIDER_SITE_OTHER): Payer: BLUE CROSS/BLUE SHIELD

## 2015-02-14 DIAGNOSIS — Z0000 Encounter for general adult medical examination without abnormal findings: Secondary | ICD-10-CM

## 2015-02-14 LAB — BASIC METABOLIC PANEL
BUN: 17 mg/dL (ref 6–23)
CO2: 30 mEq/L (ref 19–32)
Calcium: 9.1 mg/dL (ref 8.4–10.5)
Chloride: 105 mEq/L (ref 96–112)
Creatinine, Ser: 1.2 mg/dL (ref 0.40–1.50)
GFR: 67.25 mL/min (ref >60.00)
Glucose, Bld: 90 mg/dL (ref 70–99)
POTASSIUM: 4.2 meq/L (ref 3.5–5.1)
SODIUM: 142 meq/L (ref 135–145)

## 2015-02-14 LAB — HEPATIC FUNCTION PANEL
ALBUMIN: 4.2 g/dL (ref 3.5–5.2)
ALT: 15 U/L (ref 0–53)
AST: 20 U/L (ref 0–37)
Alkaline Phosphatase: 57 U/L (ref 39–117)
Bilirubin, Direct: 0.1 mg/dL (ref 0.0–0.3)
TOTAL PROTEIN: 6.6 g/dL (ref 6.0–8.3)
Total Bilirubin: 0.5 mg/dL (ref 0.2–1.2)

## 2015-02-14 LAB — POCT URINALYSIS DIPSTICK
Bilirubin, UA: NEGATIVE
Blood, UA: NEGATIVE
Glucose, UA: NEGATIVE
Ketones, UA: NEGATIVE
Leukocytes, UA: NEGATIVE
NITRITE UA: NEGATIVE
PH UA: 6.5
PROTEIN UA: NEGATIVE
SPEC GRAV UA: 1.01
Urobilinogen, UA: 0.2

## 2015-02-14 LAB — CBC WITH DIFFERENTIAL/PLATELET
BASOS ABS: 0 10*3/uL (ref 0.0–0.1)
Basophils Relative: 0.6 % (ref 0.0–3.0)
EOS ABS: 0.1 10*3/uL (ref 0.0–0.7)
Eosinophils Relative: 2.2 % (ref 0.0–5.0)
HCT: 39.9 % (ref 39.0–52.0)
HEMOGLOBIN: 13.8 g/dL (ref 13.0–17.0)
LYMPHS ABS: 0.5 10*3/uL — AB (ref 0.7–4.0)
Lymphocytes Relative: 15.2 % (ref 12.0–46.0)
MCHC: 34.5 g/dL (ref 30.0–36.0)
MCV: 92.5 fl (ref 78.0–100.0)
MONO ABS: 0.3 10*3/uL (ref 0.1–1.0)
Monocytes Relative: 7.2 % (ref 3.0–12.0)
NEUTROS ABS: 2.7 10*3/uL (ref 1.4–7.7)
Neutrophils Relative %: 74.8 % (ref 43.0–77.0)
Platelets: 180 10*3/uL (ref 150.0–400.0)
RBC: 4.31 Mil/uL (ref 4.22–5.81)
RDW: 13.2 % (ref 11.5–15.5)
WBC: 3.6 10*3/uL — ABNORMAL LOW (ref 4.0–10.5)

## 2015-02-14 LAB — LIPID PANEL
CHOL/HDL RATIO: 3
CHOLESTEROL: 193 mg/dL (ref 0–200)
HDL: 64.5 mg/dL (ref >39.00)
LDL Cholesterol: 120 mg/dL — ABNORMAL HIGH (ref 0–99)
NonHDL: 128.64
TRIGLYCERIDES: 42 mg/dL (ref 0.0–149.0)
VLDL: 8.4 mg/dL (ref 0.0–40.0)

## 2015-02-14 LAB — PSA: PSA: 0.51 ng/mL (ref 0.10–4.00)

## 2015-02-14 LAB — TSH: TSH: 2.58 u[IU]/mL (ref 0.35–4.50)

## 2015-02-21 ENCOUNTER — Encounter: Payer: BLUE CROSS/BLUE SHIELD | Admitting: Family Medicine

## 2015-02-27 ENCOUNTER — Encounter: Payer: BLUE CROSS/BLUE SHIELD | Admitting: Family Medicine

## 2015-05-08 ENCOUNTER — Ambulatory Visit: Payer: BLUE CROSS/BLUE SHIELD | Admitting: *Deleted

## 2015-05-09 ENCOUNTER — Encounter: Payer: Self-pay | Admitting: *Deleted

## 2015-05-09 ENCOUNTER — Ambulatory Visit (INDEPENDENT_AMBULATORY_CARE_PROVIDER_SITE_OTHER): Payer: BLUE CROSS/BLUE SHIELD | Admitting: *Deleted

## 2015-05-09 DIAGNOSIS — Z23 Encounter for immunization: Secondary | ICD-10-CM

## 2015-09-19 ENCOUNTER — Ambulatory Visit (INDEPENDENT_AMBULATORY_CARE_PROVIDER_SITE_OTHER): Payer: BLUE CROSS/BLUE SHIELD | Admitting: Family Medicine

## 2015-09-19 ENCOUNTER — Encounter: Payer: Self-pay | Admitting: Family Medicine

## 2015-09-19 VITALS — BP 110/70 | HR 68 | Temp 99.2°F | Wt 163.0 lb

## 2015-09-19 DIAGNOSIS — J111 Influenza due to unidentified influenza virus with other respiratory manifestations: Secondary | ICD-10-CM

## 2015-09-19 DIAGNOSIS — R6883 Chills (without fever): Secondary | ICD-10-CM

## 2015-09-19 LAB — POCT INFLUENZA A/B
INFLUENZA A, POC: POSITIVE — AB
INFLUENZA B, POC: POSITIVE — AB

## 2015-09-19 MED ORDER — OSELTAMIVIR PHOSPHATE 75 MG PO CAPS: 75.0000 mg | ORAL_CAPSULE | Freq: Two times a day (BID) | ORAL | Status: DC

## 2015-09-19 NOTE — Patient Instructions (Signed)
Treat with tamiflu for 5 days.  You are likely contagious a week from your first fever  Influenza, Adult Influenza ("the flu") is a viral infection of the respiratory tract. It occurs more often in winter months because people spend more time in close contact with one another. Influenza can make you feel very sick. Influenza easily spreads from person to person (contagious). CAUSES  Influenza is caused by a virus that infects the respiratory tract. You can catch the virus by breathing in droplets from an infected person's cough or sneeze. You can also catch the virus by touching something that was recently contaminated with the virus and then touching your mouth, nose, or eyes. RISKS AND COMPLICATIONS You may be at risk for a more severe case of influenza if you smoke cigarettes, have diabetes, have chronic heart disease (such as heart failure) or lung disease (such as asthma), or if you have a weakened immune system. Elderly people and pregnant women are also at risk for more serious infections. The most common problem of influenza is a lung infection (pneumonia). Sometimes, this problem can require emergency medical care and may be life threatening. SIGNS AND SYMPTOMS  Symptoms typically last 7 to 14 days and may include:  Fever.  Chills.  Headache, body aches, and muscle aches.  Sore throat.  Chest discomfort and cough.  Poor appetite.  Weakness or feeling tired.  Dizziness.  Nausea or vomiting. DIAGNOSIS  Diagnosis of influenza is often made based on your history and a physical exam. A nose or throat swab test can be done to confirm the diagnosis. TREATMENT  In mild cases, influenza goes away on its own. Treatment is directed at relieving symptoms. For more severe cases, your health care provider may prescribe antiviral medicines to shorten the sickness. Antibiotic medicines are not effective because the infection is caused by a virus, not by bacteria. HOME CARE  INSTRUCTIONS  Take medicines only as directed by your health care provider.  Use a cool mist humidifier to make breathing easier.  Get plenty of rest until your temperature returns to normal. This usually takes 3 to 4 days.  Drink enough fluid to keep your urine clear or pale yellow.  Cover yourmouth and nosewhen coughing or sneezing,and wash your handswellto prevent thevirusfrom spreading.  Stay homefromwork orschool untilthe fever is gonefor at least 48full day but as stated- you are likely infectious for a full week PREVENTION  An annual influenza vaccination (flu shot) is the best way to avoid getting influenza. An annual flu shot is now routinely recommended for all adults in the [place] Currently, vaccine only 47% effective SEEK MEDICAL CARE IF:  You experiencechest pain, yourcough worsens,or you producemore mucus.  Youhave nausea,vomiting, ordiarrhea.  Your fever returns or gets worse. SEEK IMMEDIATE MEDICAL CARE IF:  You havetrouble breathing, you become short of breath,or your skin ornails becomebluish.  You have severe painor stiffnessin the neck.  You develop a sudden headache, or pain in the face or ear.  You have nausea or vomiting that you cannot control. MAKE SURE YOU:   Understand these instructions.  Will watch your condition.  Will get help right away if you are not doing well or get worse.   This information is not intended to replace advice given to you by your health care provider. Make sure you discuss any questions you have with your health care provider.   Document Released: 07/02/2000 Document Revised: 07/26/2014 Document Reviewed: 10/04/2011 Elsevier Interactive Patient Education [employer].

## 2015-09-19 NOTE — Progress Notes (Signed)
PCP: Garret Reddish, MD  Subjective:  Matthew Fox is a 55 y.o. year old very pleasant male patient who presents with Flu symptoms including cough, congestion, body aches, headaches.  -started: mild URI symptoms started Tuesday but over last day has felt severe increase in symptoms including fever up to 101 and body aches- did have flu shot. Wife had confirmed flu case earlier today . -symptoms  are worsening -previous treatments: ibuprofen earlier today -sick contacts/travel/risks: endorses flu exposure.   ROS-denies fever, vomiting, diarrhea tooth pain  Pertinent Past Medical History- migraines, HLD  Medications- reviewed  Current Outpatient Prescriptions  Medication Sig Dispense Refill  . Ginseng 500 MG TABS Take 1 tablet by mouth daily.    Marland Kitchen loratadine-pseudoephedrine (CLARITIN-D 12-HOUR) 5-120 MG per tablet Take 1 tablet by mouth daily.    . SUMAtriptan (IMITREX) 50 MG tablet Take 1 tablet (50 mg total) by mouth every 2 (two) hours as needed for migraine. May repeat in 2 hours x1 if headache persists or recurs. (Patient not taking: Reported on 09/19/2015) 10 tablet 5   No current facility-administered medications for this visit.    Objective: BP 110/70 mmHg  Pulse 68  Temp(Src) 99.2 F (37.3 C)  Wt 163 lb (73.936 kg) Gen: NAD, wearing mask- frequent cough, appears fatigued HEENT: Turbinates erythematous with clear drainge TM normal, pharynx mildly erythematous with no tonsilar exudate or edema, no sinus tenderness CV: RRR no murmurs rubs or gallops Lungs: CTAB no crackles, wheeze, rhonchi Abdomen: soft/nontender/nondistended/normal bowel sounds.  Ext: no edema Skin: warm, dry, no rash Neuro: grossly normal, moves all extremities  Results for orders placed or performed in visit on 09/19/15 (from the past 24 hour(s))  POC Influenza A/B     Status: Abnormal   Collection Time: 09/19/15  4:29 PM  Result Value Ref Range   Influenza A, POC Positive (A) Negative   Influenza B, POC Positive (A) Negative   Assessment/Plan:  Influenza  Flu postive. Will treat with tamiflu x 5 days. Discussed given overall health considering not treating but patient would prefer treatment when weighing benefits/risks- sent in. Transmission and expected course discussed.   Finally, we reviewed reasons to return to care including if symptoms worsen or persist or new concerns arise.  Meds ordered this encounter  Medications  . oseltamivir (TAMIFLU) 75 MG capsule    Sig: Take 1 capsule (75 mg total) by mouth 2 (two) times daily.    Dispense:  10 capsule    Refill:  0

## 2015-12-10 ENCOUNTER — Encounter: Payer: Self-pay | Admitting: Family Medicine

## 2015-12-27 ENCOUNTER — Other Ambulatory Visit: Payer: Self-pay | Admitting: Family Medicine

## 2016-01-26 ENCOUNTER — Encounter: Payer: Self-pay | Admitting: Family Medicine

## 2016-01-26 ENCOUNTER — Ambulatory Visit (INDEPENDENT_AMBULATORY_CARE_PROVIDER_SITE_OTHER): Payer: BLUE CROSS/BLUE SHIELD | Admitting: Family Medicine

## 2016-01-26 VITALS — BP 142/88 | HR 58 | Temp 98.8°F | Ht 72.0 in | Wt 169.0 lb

## 2016-01-26 DIAGNOSIS — R052 Subacute cough: Secondary | ICD-10-CM

## 2016-01-26 DIAGNOSIS — R05 Cough: Secondary | ICD-10-CM

## 2016-01-26 MED ORDER — AZITHROMYCIN 250 MG PO TABS: ORAL_TABLET | ORAL | Status: DC

## 2016-01-26 NOTE — Progress Notes (Signed)
Subjective:  Matthew Fox is a 54 y.o. year old very pleasant male patient who presents for/with See problem oriented charting ROS- see any ROS included in HPI as well.   Past Medical History-  Patient Active Problem List   Diagnosis Date Noted  . Migraine     Priority: Medium  . Hyperlipidemia     Priority: Medium  . Adenomatous colon polyp     Priority: Low  . Leukopenia     Priority: Low    Medications- reviewed and updated Current Outpatient Prescriptions  Medication Sig Dispense Refill  . Ginseng 500 MG TABS Take 1 tablet by mouth daily.    Marland Kitchen loratadine-pseudoephedrine (CLARITIN-D 12-HOUR) 5-120 MG per tablet Take 1 tablet by mouth daily.    . SUMAtriptan (IMITREX) 50 MG tablet TAKE 1 TABLET BY MOUTH EVERY 2 HOURS AS NEEDED FOR MIGRAINE. MAY REPEAT IN 2 HOURS ONCE IF HEADACHE PERSISTS OR RECURS 10 tablet 0   No current facility-administered medications for this visit.    Objective: BP 142/88 mmHg  Pulse 58  Temp(Src) 98.8 F (37.1 C) (Oral)  Ht 6' (1.829 m)  Wt 169 lb (76.658 kg)  BMI 22.92 kg/m2  SpO2 97% Gen: NAD, resting comfortably, appers fatigued TM normal, oropharynx largely normal CV: RRR no murmurs rubs or gallops Lungs: CTAB no crackles, wheeze, rhonchi Ext: no edema Skin: warm, dry Neuro: grossly normal, moves all extremities, normal gait  Assessment/Plan:  Subacute cough S: cough feels like down in his chest. Also feels congestion in his sinuses. COugh at least 30 days and coughing. Yesterday was still running but feels run down as of today. Mainly brown phlegmNo blood in it. Never smoked. Minimal sinus pressure, mainly clear drainage from nose but mainly coughs up the productive brown sputum  Consistent with claritin D 3x a week per baseline. Took dayquil/nyquil.   ROS- no shortness of breath- still exercising, some fatigue with it, feverish today but not otherwise  A/P: No clear bacterial cause on exam found- sinusitis, pna, otitis media,  sterp throat. Given 4 weeks of symptoms and not improving discussed sinusitis, walking pna, bronchitis as cause. Azithromycin if bacterial cause gives reasonable coverage for 3 and we opted for this. No guarantee of improvement but with chronicity reasonable to trial. If no improvement in a week, get CXR. If not improvement at that point- trial prednisone. futher steps after that point- chronic cough eval Dr. Melvyn Novas if goes on another month.   Also watch BP as up today.   Meds ordered this encounter  Medications  . azithromycin (ZITHROMAX) 250 MG tablet    Sig: Take 2 tabs on day 1, then 1 tab daily until finished    Dispense:  6 tablet    Refill:  0    Return precautions advised.  Garret Reddish, MD

## 2016-01-26 NOTE — Patient Instructions (Signed)
Lingering cough at a month- could be walking pneumonia (dont always hear this on exam), could be sinusitis, could be a bronchitis that just hasn't cleared yet. Azithromycin gives reasonable coverage for all 3 so we opted for this.   Opted out of augmentin for more typical sinusitis  Opted out of steroid shot to focus on inflammation  BUT if this doesn't work - get x-ray in 7-10 days.   If that is unrevealing may trial prednisone.

## 2016-01-26 NOTE — Progress Notes (Signed)
Pre visit review using our clinic review tool, if applicable. No additional management support is needed unless otherwise documented below in the visit note. 

## 2016-01-30 ENCOUNTER — Encounter: Payer: Self-pay | Admitting: Family Medicine

## 2016-02-02 ENCOUNTER — Ambulatory Visit (INDEPENDENT_AMBULATORY_CARE_PROVIDER_SITE_OTHER): Payer: BLUE CROSS/BLUE SHIELD | Admitting: Family Medicine

## 2016-02-02 ENCOUNTER — Encounter: Payer: Self-pay | Admitting: Family Medicine

## 2016-02-02 VITALS — BP 122/80 | HR 65 | Temp 98.1°F | Ht 71.0 in | Wt 165.0 lb

## 2016-02-02 DIAGNOSIS — R05 Cough: Secondary | ICD-10-CM | POA: Diagnosis not present

## 2016-02-02 DIAGNOSIS — R052 Subacute cough: Secondary | ICD-10-CM

## 2016-02-02 MED ORDER — PREDNISONE 10 MG PO TABS: ORAL_TABLET | ORAL | Status: DC

## 2016-02-02 NOTE — Patient Instructions (Signed)
Please take prednisone as directed. You may also add either Allegra, Claritin, or Zyrtec for symptoms of allergic rhinitis. Follow up if symptoms do not improve with this treatment; a consult to pulmonology may be placed for you by Dr. Yong Channel as he has previously indicated.

## 2016-02-02 NOTE — Progress Notes (Signed)
Pre visit review using our clinic review tool, if applicable. No additional management support is needed unless otherwise documented below in the visit note. 

## 2016-02-02 NOTE — Progress Notes (Signed)
Subjective:    Patient ID: Matthew Fox, male    DOB: June 23, 1961, 55 y.o.   MRN: [id number]  HPI  Mr. Fels is a 55 year old male who presents today with chest congestion and cough which has greatly improved but is still present and has persisted for 5 weeks.  Associated symptoms of rhinitis with brown nasal drainage.  He reports that he has not used OTC cough medication in 2 days because he has not needed it.  He is here today because he reports that he is going to the beach this week and wants to feel better. History of seasonal allergies which he treats with Claritin D 3 times/week which provides benefit.  He denies fever, chills, sweats,sinus pressure/pain, ear pressure/pain, tooth pain, sore throat, or GERD symptoms.  History of sick contacts at work and completion of azithromycin therapy.  He is a nonsmoker and denies history of asthma/bronchitis.   Review of Systems  Constitutional: Negative for fever, chills and fatigue.  HENT: Positive for congestion. Negative for ear pain, postnasal drip, rhinorrhea, sinus pressure, sneezing, sore throat and tinnitus.   Respiratory: Positive for cough. Negative for shortness of breath and wheezing.   Cardiovascular: Negative for chest pain and palpitations.  Gastrointestinal: Negative for nausea, vomiting, abdominal pain, diarrhea and constipation.  Musculoskeletal: Negative for myalgias.  Skin: Negative for rash.  Neurological: Negative for dizziness, light-headedness and headaches.   Past Medical History  Diagnosis Date  . Leukopenia     stable in the 3s  . Hyperlipidemia     no rx. total just above 200, HDL great, LDL in 120s 130s  . Migraine     relpax about once a month  . Allergy   . Adenomatous colon polyp     2013, 5 year repeat     Social History   Social History  . Marital Status: Married    Spouse Name: N/A  . Number of Children: N/A  . Years of Education: N/A   Occupational History  . Not on file.   Social  History Main Topics  . Smoking status: Never Smoker   . Smokeless tobacco: Never Used  . Alcohol Use: 4.2 oz/week    7 Standard drinks or equivalent per week  . Drug Use: No  . Sexual Activity: Not on file   Other Topics Concern  . Not on file   Social History Narrative   Married (wife patient of Dr. Yong Channel), 2 daughters Janett Billow 27 NP in [place] with oncology and Lilia Pro 20 sophomore at [employer] going into Business.       Works at [employer]: running, biking, golf    Past Surgical History  Procedure Laterality Date  . Hernia repair      as infant; bilateral  . Anal fissure repair  2007    Family History  Problem Relation Age of Onset  . Lung cancer Mother     smoker, died 44 age 28  . Heart failure Father     rheumatic fever-died 23 months after mother, did not take care of himself  . Colon cancer Maternal Grandfather     early 51s  . Stomach cancer Neg Hx     No Known Allergies  Current Outpatient Prescriptions on File Prior to Visit  Medication Sig Dispense Refill  . Ginseng 500 MG TABS Take 1 tablet by mouth daily.    Marland Kitchen loratadine-pseudoephedrine (CLARITIN-D 12-HOUR) 5-120 MG per tablet Take 1  tablet by mouth daily.    . SUMAtriptan (IMITREX) 50 MG tablet TAKE 1 TABLET BY MOUTH EVERY 2 HOURS AS NEEDED FOR MIGRAINE. MAY REPEAT IN 2 HOURS ONCE IF HEADACHE PERSISTS OR RECURS 10 tablet 0   No current facility-administered medications on file prior to visit.    BP 122/80 mmHg  Pulse 65  Temp(Src) 98.1 F (36.7 C) (Oral)  Ht 5\' 11"  (1.803 m)  Wt 165 lb (74.844 kg)  BMI 23.02 kg/m2  SpO2 99%        Objective:   Physical Exam  Constitutional: He is oriented to person, place, and time. He appears well-developed and well-nourished.  Thin male who is a long term runner. NAD  HENT:  Right Ear: Tympanic membrane normal.  Left Ear: Tympanic membrane normal.  Nose: No rhinorrhea. Right sinus exhibits no maxillary sinus tenderness and  no frontal sinus tenderness. Left sinus exhibits no maxillary sinus tenderness and no frontal sinus tenderness.  Mouth/Throat: Mucous membranes are normal. No oropharyngeal exudate or posterior oropharyngeal erythema.  Mild nasal erythema noted  Eyes: Pupils are equal, round, and reactive to light. No scleral icterus.  Neck: Neck supple.  Cardiovascular: Normal rate and regular rhythm.   Pulmonary/Chest: Effort normal and breath sounds normal. He has no wheezes. He has no rales.  Abdominal: Soft. Bowel sounds are normal.  Lymphadenopathy:    He has no cervical adenopathy.  Neurological: He is alert and oriented to person, place, and time.  Skin: Skin is warm and dry. No rash noted.  Psychiatric: He has a normal mood and affect. His behavior is normal. Judgment and thought content normal.          Assessment & Plan:  1. Subacute cough Suspect that symptoms of cough are resolving with previous treatment provided by his PCP.  We discussed potential causes such as sinusitis, PNA, and bronchitis which can be responsible for a cough. Discussed chest X-ray however this will be on hold since symptoms have improved, patient preference, and trial of prednisone will be implemented. Also advised that if symptoms do not improve with prednisone or worsen, he will contact Dr. Yong Channel for a referral to pulmonology as previously discussed. Patient agrees with plan.  - predniSONE (DELTASONE) 10 MG tablet; Take 4 tablets by mouth once daily for 2 days, 3 tablets daily for 2 days, 2 tablets daily for 2 days, and one tablet daily for 2 days.  Dispense: 20 tablet; Refill: 0  Allegra, Claritin, or Zyrtec can be used with flonase for symptoms of allergic rhinitis in lieu of Claritin D.  Delano Metz, FNP-C

## 2016-03-02 ENCOUNTER — Other Ambulatory Visit (INDEPENDENT_AMBULATORY_CARE_PROVIDER_SITE_OTHER): Payer: BLUE CROSS/BLUE SHIELD

## 2016-03-02 DIAGNOSIS — Z0000 Encounter for general adult medical examination without abnormal findings: Secondary | ICD-10-CM | POA: Diagnosis not present

## 2016-03-02 LAB — CBC WITH DIFFERENTIAL/PLATELET
BASOS PCT: 0.8 % (ref 0.0–3.0)
Basophils Absolute: 0 10*3/uL (ref 0.0–0.1)
EOS ABS: 0.2 10*3/uL (ref 0.0–0.7)
Eosinophils Relative: 4.4 % (ref 0.0–5.0)
HEMATOCRIT: 42.2 % (ref 39.0–52.0)
Hemoglobin: 14.5 g/dL (ref 13.0–17.0)
LYMPHS PCT: 22.3 % (ref 12.0–46.0)
Lymphs Abs: 0.8 10*3/uL (ref 0.7–4.0)
MCHC: 34.3 g/dL (ref 30.0–36.0)
MCV: 92.3 fl (ref 78.0–100.0)
Monocytes Absolute: 0.3 10*3/uL (ref 0.1–1.0)
Monocytes Relative: 9.5 % (ref 3.0–12.0)
NEUTROS ABS: 2.2 10*3/uL (ref 1.4–7.7)
Neutrophils Relative %: 63 % (ref 43.0–77.0)
PLATELETS: 175 10*3/uL (ref 150.0–400.0)
RBC: 4.58 Mil/uL (ref 4.22–5.81)
RDW: 13.5 % (ref 11.5–15.5)
WBC: 3.6 10*3/uL — ABNORMAL LOW (ref 4.0–10.5)

## 2016-03-02 LAB — HEPATIC FUNCTION PANEL
ALT: 14 U/L (ref 0–53)
AST: 22 U/L (ref 0–37)
Albumin: 4.3 g/dL (ref 3.5–5.2)
Alkaline Phosphatase: 51 U/L (ref 39–117)
BILIRUBIN DIRECT: 0.1 mg/dL (ref 0.0–0.3)
TOTAL PROTEIN: 6.5 g/dL (ref 6.0–8.3)
Total Bilirubin: 0.6 mg/dL (ref 0.2–1.2)

## 2016-03-02 LAB — POC URINALSYSI DIPSTICK (AUTOMATED)
BILIRUBIN UA: NEGATIVE
Glucose, UA: NEGATIVE
KETONES UA: NEGATIVE
LEUKOCYTES UA: NEGATIVE
NITRITE UA: NEGATIVE
PH UA: 6.5
Protein, UA: NEGATIVE
RBC UA: NEGATIVE
SPEC GRAV UA: 1.01
UROBILINOGEN UA: 0.2

## 2016-03-02 LAB — BASIC METABOLIC PANEL
BUN: 18 mg/dL (ref 6–23)
CALCIUM: 9.4 mg/dL (ref 8.4–10.5)
CHLORIDE: 102 meq/L (ref 96–112)
CO2: 29 meq/L (ref 19–32)
CREATININE: 1.12 mg/dL (ref 0.40–1.50)
GFR: 72.54 mL/min (ref >60.00)
Glucose, Bld: 97 mg/dL (ref 70–99)
Potassium: 4.1 mEq/L (ref 3.5–5.1)
Sodium: 138 mEq/L (ref 135–145)

## 2016-03-02 LAB — LIPID PANEL
CHOL/HDL RATIO: 3
CHOLESTEROL: 230 mg/dL — AB (ref 0–200)
HDL: 73.2 mg/dL (ref >39.00)
LDL Cholesterol: 148 mg/dL — ABNORMAL HIGH (ref 0–99)
NonHDL: 157.05
TRIGLYCERIDES: 47 mg/dL (ref 0.0–149.0)
VLDL: 9.4 mg/dL (ref 0.0–40.0)

## 2016-03-02 LAB — TSH: TSH: 2.06 u[IU]/mL (ref 0.35–4.50)

## 2016-03-02 LAB — PSA: PSA: 0.52 ng/mL (ref 0.10–4.00)

## 2016-03-08 ENCOUNTER — Encounter: Payer: BLUE CROSS/BLUE SHIELD | Admitting: Family Medicine

## 2016-03-18 ENCOUNTER — Ambulatory Visit (INDEPENDENT_AMBULATORY_CARE_PROVIDER_SITE_OTHER): Payer: BLUE CROSS/BLUE SHIELD | Admitting: Family Medicine

## 2016-03-18 ENCOUNTER — Encounter: Payer: BLUE CROSS/BLUE SHIELD | Admitting: Family Medicine

## 2016-03-18 ENCOUNTER — Encounter: Payer: Self-pay | Admitting: Family Medicine

## 2016-03-18 VITALS — BP 118/84 | HR 56 | Temp 97.6°F | Ht 70.25 in | Wt 167.6 lb

## 2016-03-18 DIAGNOSIS — Z0000 Encounter for general adult medical examination without abnormal findings: Secondary | ICD-10-CM

## 2016-03-18 DIAGNOSIS — Z23 Encounter for immunization: Secondary | ICD-10-CM

## 2016-03-18 NOTE — Progress Notes (Signed)
Phone: [phone]  Subjective:  Patient presents today for their annual physical. Chief complaint-noted.   See problem oriented charting- ROS- full  review of systems was completed and negative including No chest pain or shortness of breath. No headache or blurry vision. Chronic cough cleared  The following were reviewed and entered/updated in epic: Past Medical History:  Diagnosis Date  . Adenomatous colon polyp    2013, 5 year repeat  . Allergy   . Hyperlipidemia    no rx. total just above 200, HDL great, LDL in 120s 130s  . Leukopenia    stable in the 3s  . Migraine    relpax about once a month   Patient Active Problem List   Diagnosis Date Noted  . Migraine     Priority: Medium  . Hyperlipidemia     Priority: Medium  . Adenomatous colon polyp     Priority: Low  . Leukopenia     Priority: Low   Past Surgical History:  Procedure Laterality Date  . ANAL FISSURE REPAIR  2007  . HERNIA REPAIR     as infant; bilateral    Family History  Problem Relation Age of Onset  . Lung cancer Mother     smoker, died 1 age 23  . Heart failure Father     rheumatic fever-died 23 months after mother, did not take care of himself  . Colon cancer Maternal Grandfather     early 27s  . Stomach cancer Neg Hx     Medications- reviewed and updated Current Outpatient Prescriptions  Medication Sig Dispense Refill  . Ginseng 500 MG TABS Take 1 tablet by mouth daily.    Marland Kitchen loratadine-pseudoephedrine (CLARITIN-D 12-HOUR) 5-120 MG per tablet Take 1 tablet by mouth daily.    . SUMAtriptan (IMITREX) 50 MG tablet TAKE 1 TABLET BY MOUTH EVERY 2 HOURS AS NEEDED FOR MIGRAINE. MAY REPEAT IN 2 HOURS ONCE IF HEADACHE PERSISTS OR RECURS 10 tablet 0   No current facility-administered medications for this visit.     Allergies-reviewed and updated No Known Allergies  Social History   Social History  . Marital status: Married    Spouse name: N/A  . Number of children: N/A  . Years of  education: N/A   Social History Main Topics  . Smoking status: Never Smoker  . Smokeless tobacco: Never Used  . Alcohol use 4.2 oz/week    7 Standard drinks or equivalent per week  . Drug use: No  . Sexual activity: Not Asked   Other Topics Concern  . None   Social History Narrative   Married (wife patient of Dr. Yong Channel), 2 daughters Janett Billow 27 NP in [place] with oncology and Lilia Pro 20 sophomore at [employer] going into Business.       Works at [employer]: running, biking, golf    Objective: BP 118/84 (BP Location: Left Arm, Patient Position: Sitting, Cuff Size: Normal)   Pulse (!) 56   Temp 97.6 F (36.4 C) (Oral)   Ht 5' 10.25" (1.784 m)   Wt 167 lb 9.6 oz (76 kg)   SpO2 97%   BMI 23.88 kg/m  Gen: NAD, resting comfortably In excellent physical shape for any age HEENT: Mucous membranes are moist. Oropharynx normal Neck: no thyromegaly CV: RRR no murmurs rubs or gallops Lungs: CTAB no crackles, wheeze, rhonchi Abdomen: soft/nontender/nondistended/normal bowel sounds. No rebound or guarding.  Ext: no edema Skin: warm, dry Neuro: grossly normal, moves all  extremities, PERRLA  Declines rectal  Assessment/Plan:  55 y.o. male presenting for annual physical.  Health Maintenance counseling: 1. Anticipatory guidance: Patient counseled regarding regular dental exams, eye exams (preglaucoma suspect before- now doing better), wearing seatbelts.  2. Risk factor reduction:  Advised patient of need for regular exercise and diet rich and fruits and vegetables to reduce risk of heart attack and stroke. Patient is doing phenomenally well. 12-16 miles a week.  3. Immunizations/screenings/ancillary studies Immunization History  Administered Date(s) Administered  . Influenza Split 04/10/2012, 03/28/2013  . Influenza,inj,Quad PF,36+ Mos 05/09/2015  . Influenza-Unspecified 04/18/2013, 05/13/2014  . Td 07/28/2007   Health Maintenance Due  Topic Date Due  .  Hepatitis C Screening -declines 04[birth year]19/1961  . HIV Screening - declines 11/13/1976  . INFLUENZA VACCINE - today 02/17/2016   4. Prostate cancer screening- low risk based off psa, decline rectal  Lab Results  Component Value Date   PSA 0.52 03/02/2016   PSA 0.51 02/14/2015   PSA 0.45 05/23/2013   5. Colon cancer screening - 05/01/12 with history adenoma so 5 year follow up 6. Skin cancer screening- [facility] 6-12 months  Status of chronic or acute concerns  Migraines- sumatriptan prn. 1-2 x a month stable  HLD- 3.7% 10 year risk, remain off statin with low risk  Leukopenia-  Stable in 3s. Discussed heme option- declines as long as stable as well as other cell lines stable  Return in about 1 year (around 03/18/2017) for physical.  Flu shot given   Return precautions advised.   Garret Reddish, MD

## 2016-03-18 NOTE — Progress Notes (Signed)
Pre visit review using our clinic review tool, if applicable. No additional management support is needed unless otherwise documented below in the visit note. 

## 2016-03-18 NOTE — Patient Instructions (Addendum)
Flu shot today  Keep taking great care of yourself!

## 2016-05-21 ENCOUNTER — Encounter: Payer: BLUE CROSS/BLUE SHIELD | Admitting: Family Medicine

## 2016-05-27 ENCOUNTER — Other Ambulatory Visit: Payer: Self-pay | Admitting: Family Medicine

## 2016-06-17 DIAGNOSIS — H40012 Open angle with borderline findings, low risk, left eye: Secondary | ICD-10-CM | POA: Diagnosis not present

## 2016-06-17 DIAGNOSIS — H5213 Myopia, bilateral: Secondary | ICD-10-CM | POA: Diagnosis not present

## 2016-06-17 DIAGNOSIS — H40011 Open angle with borderline findings, low risk, right eye: Secondary | ICD-10-CM | POA: Diagnosis not present

## 2016-07-01 DIAGNOSIS — D2271 Melanocytic nevi of right lower limb, including hip: Secondary | ICD-10-CM | POA: Diagnosis not present

## 2016-07-01 DIAGNOSIS — Z85828 Personal history of other malignant neoplasm of skin: Secondary | ICD-10-CM | POA: Diagnosis not present

## 2016-07-01 DIAGNOSIS — Z86018 Personal history of other benign neoplasm: Secondary | ICD-10-CM | POA: Diagnosis not present

## 2016-07-01 DIAGNOSIS — D225 Melanocytic nevi of trunk: Secondary | ICD-10-CM | POA: Diagnosis not present

## 2016-11-07 ENCOUNTER — Other Ambulatory Visit: Payer: Self-pay | Admitting: Family Medicine

## 2016-12-21 DIAGNOSIS — H40013 Open angle with borderline findings, low risk, bilateral: Secondary | ICD-10-CM | POA: Diagnosis not present

## 2017-02-18 ENCOUNTER — Encounter: Payer: Self-pay | Admitting: *Deleted

## 2017-03-22 ENCOUNTER — Encounter: Payer: Self-pay | Admitting: Internal Medicine

## 2017-04-21 DIAGNOSIS — H524 Presbyopia: Secondary | ICD-10-CM | POA: Diagnosis not present

## 2017-04-21 DIAGNOSIS — H40013 Open angle with borderline findings, low risk, bilateral: Secondary | ICD-10-CM | POA: Diagnosis not present

## 2017-05-05 ENCOUNTER — Ambulatory Visit (AMBULATORY_SURGERY_CENTER): Payer: Self-pay

## 2017-05-05 VITALS — Ht 71.0 in | Wt 168.6 lb

## 2017-05-05 DIAGNOSIS — Z860100 Personal history of colon polyps, unspecified: Secondary | ICD-10-CM

## 2017-05-05 MED ORDER — SUPREP BOWEL PREP KIT 17.5-3.13-1.6 GM/177ML PO SOLN: 1.0000 | Freq: Once | ORAL | 0 refills | Status: AC

## 2017-05-05 NOTE — Progress Notes (Signed)
No allergies to eggs or soy No diet meds No home oxygen No past problems with anesthesia  Declined emmi 

## 2017-05-11 ENCOUNTER — Encounter: Payer: Self-pay | Admitting: Internal Medicine

## 2017-05-19 ENCOUNTER — Encounter: Payer: Self-pay | Admitting: Internal Medicine

## 2017-05-19 ENCOUNTER — Ambulatory Visit (AMBULATORY_SURGERY_CENTER): Payer: BLUE CROSS/BLUE SHIELD | Admitting: Internal Medicine

## 2017-05-19 VITALS — BP 142/91 | HR 56 | Temp 97.5°F | Resp 15 | Ht 70.25 in | Wt 167.0 lb

## 2017-05-19 DIAGNOSIS — Z860100 Personal history of colon polyps, unspecified: Secondary | ICD-10-CM

## 2017-05-19 MED ORDER — SODIUM CHLORIDE 0.9 % IV SOLN: 500.0000 mL | INTRAVENOUS | Status: DC

## 2017-05-19 NOTE — Progress Notes (Signed)
No problems noted in the recovery room. maw 

## 2017-05-19 NOTE — Op Note (Signed)
[facility] Patient Name: Matthew Fox Procedure Date: 05/19/2017 7:56 AM MRN: [id number] Endoscopist: Jerene Bears , MD Age: 57 Referring MD:  Date of Birth: 1961/05/09 Gender: Male Account #: [id number] Procedure:                Colonoscopy Indications:              Surveillance: Personal history of adenomatous                            polyps on last colonoscopy 5 years ago Medicines:                Monitored Anesthesia Care Procedure:                Pre-Anesthesia Assessment:                           - Prior to the procedure, a History and Physical                            was performed, and patient medications and                            allergies were reviewed. The patient's tolerance of                            previous anesthesia was also reviewed. The risks                            and benefits of the procedure and the sedation                            options and risks were discussed with the patient.                            All questions were answered, and informed consent                            was obtained. Prior Anticoagulants: The patient has                            taken no previous anticoagulant or antiplatelet                            agents. ASA Grade Assessment: II - A patient with                            mild systemic disease. After reviewing the risks                            and benefits, the patient was deemed in                            satisfactory condition to undergo the procedure.  After obtaining informed consent, the colonoscope                            was passed under direct vision. Throughout the                            procedure, the patient's blood pressure, pulse, and                            oxygen saturations were monitored continuously. The                            Colonoscope was introduced through the anus and                            advanced to the the cecum,  identified by                            appendiceal orifice and ileocecal valve. The                            colonoscopy was performed without difficulty. The                            patient tolerated the procedure well. The quality                            of the bowel preparation was good. The ileocecal                            valve, appendiceal orifice, and rectum were                            photographed. Scope In: 8:13:54 AM Scope Out: 8:28:30 AM Scope Withdrawal Time: 0 hours 10 minutes 25 seconds  Total Procedure Duration: 0 hours 14 minutes 36 seconds  Findings:                 The perianal exam findings include non-thrombosed                            internal hemorrhoids and internal hemorrhoids that                            prolapse with straining, but require manual                            replacement into the anal canal (Grade III).                           A few small-mouthed diverticula were found in the                            sigmoid colon and descending colon.  Internal hemorrhoids were found during retroflexion                            and during digital exam. The hemorrhoids were                            medium-sized.                           The exam was otherwise without abnormality. Complications:            No immediate complications. Estimated Blood Loss:     Estimated blood loss: none. Impression:               - Mild diverticulosis in the sigmoid colon and in                            the descending colon.                           - Internal hemorrhoids.                           - The examination was otherwise normal.                           - No specimens collected. Recommendation:           - Patient has a contact number available for                            emergencies. The signs and symptoms of potential                            delayed complications were discussed with the                             patient. Return to normal activities tomorrow.                            Written discharge instructions were provided to the                            patient.                           - Resume previous diet.                           - Continue present medications.                           - Repeat colonoscopy in 10 years for screening                            purposes. Jerene Bears, MD 05/19/2017 8:37:37 AM This report has been signed electronically.

## 2017-05-19 NOTE — Progress Notes (Signed)
To recovery, report to RN, VSS. 

## 2017-05-19 NOTE — Progress Notes (Signed)
Pt's states no medical or surgical changes since previsit or office visit. 

## 2017-05-19 NOTE — Patient Instructions (Addendum)
YOU HAD AN ENDOSCOPIC PROCEDURE TODAY AT [facility]:   Refer to the procedure report that was given to you for any specific questions about what was found during the examination.  If the procedure report does not answer your questions, please call your gastroenterologist to clarify.  If you requested that your care partner not be given the details of your procedure findings, then the procedure report has been included in a sealed envelope for you to review at your convenience later.  YOU SHOULD EXPECT: Some feelings of bloating in the abdomen. Passage of more gas than usual.  Walking can help get rid of the air that was put into your GI tract during the procedure and reduce the bloating. If you had a lower endoscopy (such as a colonoscopy or flexible sigmoidoscopy) you may notice spotting of blood in your stool or on the toilet paper. If you underwent a bowel prep for your procedure, you may not have a normal bowel movement for a few days.  Please Note:  You might notice some irritation and congestion in your nose or some drainage.  This is from the oxygen used during your procedure.  There is no need for concern and it should clear up in a day or so.  SYMPTOMS TO REPORT IMMEDIATELY:   Following lower endoscopy (colonoscopy or flexible sigmoidoscopy):  Excessive amounts of blood in the stool  Significant tenderness or worsening of abdominal pains  Swelling of the abdomen that is new, acute  Fever of 100F or higher   For urgent or emergent issues, a gastroenterologist can be reached at any hour by calling [phone].   DIET:  We do recommend a small meal at first, but then you may proceed to your regular diet.  Drink plenty of fluids but you should avoid alcoholic beverages for 24 hours.  ACTIVITY:  You should plan to take it easy for the rest of today and you should NOT DRIVE or use heavy machinery until tomorrow (because of the sedation medicines used during the test).     FOLLOW UP: Our staff will call the number listed on your records the next business day following your procedure to check on you and address any questions or concerns that you may have regarding the information given to you following your procedure. If we do not reach you, we will leave a message.  However, if you are feeling well and you are not experiencing any problems, there is no need to return our call.  We will assume that you have returned to your regular daily activities without incident.  If any biopsies were taken you will be contacted by phone or by letter within the next 1-3 weeks.  Please call us at [phone] if you have not heard about the biopsies in 3 weeks.    SIGNATURES/CONFIDENTIALITY: You and/or your care partner have signed paperwork which will be entered into your electronic medical record.  These signatures attest to the fact that that the information above on your After Visit Summary has been reviewed and is understood.  Full responsibility of the confidentiality of this discharge information lies with you and/or your care-partner.   Handouts were given to your care partner on diverticulosis. Hemorrhoids, and hemorrhoids banding info. You may resume your current medications today. Repeat colonoscopy in 10 years for screening purposes. Please call if any questions or concerns.

## 2017-05-20 ENCOUNTER — Telehealth: Payer: Self-pay | Admitting: *Deleted

## 2017-05-20 NOTE — Telephone Encounter (Signed)
  Follow up Call-  Call back number 05/19/2017  Post procedure Call Back phone  # [phone]  Permission to leave phone message Yes  Some recent data might be hidden     Patient questions:  Do you have a fever, pain , or abdominal swelling? No. Pain Score  0 *  Have you tolerated food without any problems? Yes.    Have you been able to return to your normal activities? Yes.    Do you have any questions about your discharge instructions: Diet   No. Medications  No. Follow up visit  No.  Do you have questions or concerns about your Care? No.  Actions: * If pain score is 4 or above: No action needed, pain <4.

## 2017-06-15 NOTE — Progress Notes (Addendum)
Phone: [phone]  Subjective:  Patient presents today for their annual physical. Chief complaint-noted.   See problem oriented charting- ROS- full  review of systems was completed and negative except for: seasonal allergies, headaches/migraines  The following were reviewed and entered/updated in epic: Past Medical History:  Diagnosis Date  . Adenomatous colon polyp    2013, 5 year repeat  . Allergy   . Hyperlipidemia    no rx. total just above 200, HDL great, LDL in 120s 130s  . Leukopenia    stable in the 3s  . Migraine    relpax about once a month   Patient Active Problem List   Diagnosis Date Noted  . Migraine     Priority: Medium  . Hyperlipidemia     Priority: Medium  . Adenomatous colon polyp     Priority: Low  . Leukopenia     Priority: Low  . Allergic rhinitis 06/16/2017   Past Surgical History:  Procedure Laterality Date  . ANAL FISSURE REPAIR  2007  . COLONOSCOPY W/ POLYPECTOMY    . HERNIA REPAIR     as infant; bilateral  . VASECTOMY      Family History  Problem Relation Age of Onset  . Colon cancer Maternal Grandfather        early 15s  . Lung cancer Mother        smoker, died 33 age 20  . Heart failure Father        rheumatic fever-died 23 months after mother, did not take care of himself  . Stomach cancer Neg Hx     Medications- reviewed and updated Current Outpatient Medications  Medication Sig Dispense Refill  . fexofenadine (ALLEGRA) 180 MG tablet Take 180 mg by mouth daily.    . Ginseng 500 MG TABS Take 1 tablet by mouth daily.    Marland Kitchen loratadine-pseudoephedrine (CLARITIN-D 12-HOUR) 5-120 MG per tablet Take 1 tablet by mouth daily.    . SUMAtriptan (IMITREX) 50 MG tablet TAKE 1 TABLET BY MOUTH EVERY 2 HOURS AS NEEDED FOR MIGRAINE. MAY REPEAT IN 2 HOURS ONCE IF HEADACHE PERSISTS OR RECURS 10 tablet 0   No current facility-administered medications for this visit.     Allergies-reviewed and updated No Known Allergies  Social History    Socioeconomic History  . Marital status: Married    Spouse name: None  . Number of children: None  . Years of education: None  . Highest education level: None  Social Needs  . Financial resource strain: None  . Food insecurity - worry: None  . Food insecurity - inability: None  . Transportation needs - medical: None  . Transportation needs - non-medical: None  Occupational History  . None  Tobacco Use  . Smoking status: Never Smoker  . Smokeless tobacco: Never Used  Substance and Sexual Activity  . Alcohol use: Yes    Alcohol/week: 4.2 oz    Types: 7 Standard drinks or equivalent per week  . Drug use: No  . Sexual activity: None  Other Topics Concern  . None  Social History Narrative   Married (wife patient of Dr. Yong Channel), 2 daughters Janett Billow 27 NP in [place] with oncology and Lilia Pro 20 sophomore at [employer] going into Business.       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       Hobbies: running, biking, golf   Objective: BP 138/88 (BP  Location: Left Arm, Patient Position: Sitting, Cuff Size: Large)   Pulse (!) 53   Temp (!) 97.4 F (36.3 C) (Oral)   Ht 5' 10.25" (1.784 m)   Wt 170 lb 3.2 oz (77.2 kg)   SpO2 95%   BMI 24.25 kg/m  Gen: NAD, resting comfortably, athletic build HEENT: Mucous membranes are moist. Oropharynx normal Neck: no thyromegaly CV: RRR no murmurs rubs or gallops Lungs: CTAB no crackles, wheeze, rhonchi Abdomen: soft/nontender/nondistended/normal bowel sounds. No rebound or guarding.  Ext: no edema Skin: warm, dry Neuro: grossly normal, moves all extremities, PERRLA  Declines rectal  Assessment/Plan:  56 y.o. male presenting for annual physical.  Health Maintenance counseling: 1. Anticipatory guidance: Patient counseled regarding regular dental exams q6 months, eye exams yearly- sees as has been told preglaucoma in the past-now looking better, wearing  seatbelts.  2. Risk factor reduction:  Advised patient of need for regular exercise and diet rich and fruits and vegetables to reduce risk of heart attack and stroke. Exercise- bowflex 2-3 days a week plus running 2-3 days a week. Diet-reasonable, weight stale.  Wt Readings from Last 3 Encounters:  06/16/17 170 lb 3.2 oz (77.2 kg)  05/19/17 167 lb (75.8 kg)  05/05/17 168 lb 9.6 oz (76.5 kg)  3. Immunizations/screenings/ancillary studies- would like to give shingrix when available- likely discuss next year. Declined hcv and hiv last year Immunization History  Administered Date(s) Administered  . Influenza Split 04/10/2012, 03/28/2013  . Influenza,inj,Quad PF,6+ Mos 05/09/2015, 03/18/2016  . Influenza-Unspecified 04/18/2013, 05/13/2014, 04/11/2017  . Td 07/28/2007  4. Prostate cancer screening-  declined rectal last year- declines again- knows some risk of misking nodular area. Update psa trend.  Lab Results  Component Value Date   PSA 0.52 03/02/2016   PSA 0.51 02/14/2015   PSA 0.45 05/23/2013   5. Colon cancer screening - 05/19/17 and was told 10 year follow up this time after 5 year follow up previously due to adenoma history in 2013 6. Skin cancer screening- sees Dr. Delman Cheadle every 6-92months. advised regular sunscreen use. Denies worrisome, changing, or new skin lesions.   Status of chronic or acute concerns   Asked patient to follow dash eating plan and watch BP at home as appears to be trending up- already in excellent health so may be headed toward medication  Hyperlipidemia HLD- last year 3.7% 10 year risk, will update lipids and risk score today  Migraine Migraines- uses sumatriptan 1-2x a month typically- makes him feel slightly off so usually bears with it. Tylenol can help but he seems to use unusually high dose 1500 mg which I advised against. could trial 600-800 mg ibuprofen trial.   Leukopenia Leukopenia- update cbc with diff. Continue to monitor as long as leukopenia only  and other cell lines stable. He states has been like this since 1985  Allergic rhinitis Allergic rhinitis- switches back and forth between allegra and claritin every other day  1 year CPE  Orders Placed This Encounter  Procedures  . CBC with Differential/Platelet  . Comprehensive metabolic panel    [place]    Order Specific Question:   Has the patient fasted?    Answer:   No  . PSA  . Lipid panel    [place]    Order Specific Question:   Has the patient fasted?    Answer:   No   Return precautions advised.  Garret Reddish, MD

## 2017-06-16 ENCOUNTER — Ambulatory Visit (INDEPENDENT_AMBULATORY_CARE_PROVIDER_SITE_OTHER): Payer: BLUE CROSS/BLUE SHIELD | Admitting: Family Medicine

## 2017-06-16 ENCOUNTER — Encounter: Payer: Self-pay | Admitting: Family Medicine

## 2017-06-16 VITALS — BP 138/88 | HR 53 | Temp 97.4°F | Ht 70.25 in | Wt 170.2 lb

## 2017-06-16 DIAGNOSIS — D126 Benign neoplasm of colon, unspecified: Secondary | ICD-10-CM | POA: Diagnosis not present

## 2017-06-16 DIAGNOSIS — J309 Allergic rhinitis, unspecified: Secondary | ICD-10-CM | POA: Insufficient documentation

## 2017-06-16 DIAGNOSIS — D72819 Decreased white blood cell count, unspecified: Secondary | ICD-10-CM

## 2017-06-16 DIAGNOSIS — Z0000 Encounter for general adult medical examination without abnormal findings: Secondary | ICD-10-CM | POA: Diagnosis not present

## 2017-06-16 DIAGNOSIS — E785 Hyperlipidemia, unspecified: Secondary | ICD-10-CM | POA: Diagnosis not present

## 2017-06-16 DIAGNOSIS — J301 Allergic rhinitis due to pollen: Secondary | ICD-10-CM

## 2017-06-16 DIAGNOSIS — Z125 Encounter for screening for malignant neoplasm of prostate: Secondary | ICD-10-CM | POA: Diagnosis not present

## 2017-06-16 DIAGNOSIS — G43909 Migraine, unspecified, not intractable, without status migrainosus: Secondary | ICD-10-CM | POA: Diagnosis not present

## 2017-06-16 LAB — CBC WITH DIFFERENTIAL/PLATELET
BASOS ABS: 0 10*3/uL (ref 0.0–0.1)
Basophils Relative: 1 % (ref 0.0–3.0)
Eosinophils Absolute: 0.1 10*3/uL (ref 0.0–0.7)
Eosinophils Relative: 3.1 % (ref 0.0–5.0)
HCT: 43.1 % (ref 39.0–52.0)
Hemoglobin: 14.4 g/dL (ref 13.0–17.0)
LYMPHS ABS: 0.8 10*3/uL (ref 0.7–4.0)
Lymphocytes Relative: 24.8 % (ref 12.0–46.0)
MCHC: 33.5 g/dL (ref 30.0–36.0)
MCV: 94.3 fl (ref 78.0–100.0)
MONO ABS: 0.4 10*3/uL (ref 0.1–1.0)
MONOS PCT: 11.4 % (ref 3.0–12.0)
NEUTROS ABS: 1.9 10*3/uL (ref 1.4–7.7)
NEUTROS PCT: 59.7 % (ref 43.0–77.0)
PLATELETS: 176 10*3/uL (ref 150.0–400.0)
RBC: 4.57 Mil/uL (ref 4.22–5.81)
RDW: 13 % (ref 11.5–15.5)
WBC: 3.3 10*3/uL — ABNORMAL LOW (ref 4.0–10.5)

## 2017-06-16 LAB — LIPID PANEL
CHOL/HDL RATIO: 4
Cholesterol: 227 mg/dL — ABNORMAL HIGH (ref 0–200)
HDL: 64.4 mg/dL (ref >39.00)
LDL CALC: 152 mg/dL — AB (ref 0–99)
NONHDL: 162.5
Triglycerides: 53 mg/dL (ref 0.0–149.0)
VLDL: 10.6 mg/dL (ref 0.0–40.0)

## 2017-06-16 LAB — COMPREHENSIVE METABOLIC PANEL
ALT: 14 U/L (ref 0–53)
AST: 18 U/L (ref 0–37)
Albumin: 4.3 g/dL (ref 3.5–5.2)
Alkaline Phosphatase: 46 U/L (ref 39–117)
BUN: 17 mg/dL (ref 6–23)
CHLORIDE: 104 meq/L (ref 96–112)
CO2: 31 meq/L (ref 19–32)
CREATININE: 1.11 mg/dL (ref 0.40–1.50)
Calcium: 9.6 mg/dL (ref 8.4–10.5)
GFR: 72.94 mL/min (ref >60.00)
GLUCOSE: 99 mg/dL (ref 70–99)
POTASSIUM: 4.4 meq/L (ref 3.5–5.1)
SODIUM: 139 meq/L (ref 135–145)
TOTAL PROTEIN: 6.8 g/dL (ref 6.0–8.3)
Total Bilirubin: 0.7 mg/dL (ref 0.2–1.2)

## 2017-06-16 LAB — PSA: PSA: 0.53 ng/mL (ref 0.10–4.00)

## 2017-06-16 NOTE — Patient Instructions (Addendum)
Please stop by lab before you go  Blood pressure up slightly but not at point of medicine. If you want could check blood pressure at a pharmacy if you are there- goal <140/90 and if you consistently see #s above that- come see [place] sooner than 1 year, otherwise see you 1 year for physical   DASH Eating Plan DASH stands for "Dietary Approaches to Stop Hypertension." The DASH eating plan is a healthy eating plan that has been shown to reduce high blood pressure (hypertension). It may also reduce your risk for type 2 diabetes, heart disease, and stroke. The DASH eating plan may also help with weight loss. What are tips for following this plan? General guidelines  Avoid eating more than 2,300 mg (milligrams) of salt (sodium) a day. If you have hypertension, you may need to reduce your sodium intake to 1,500 mg a day.  Limit alcohol intake to no more than 1 drink a day for nonpregnant women and 2 drinks a day for men. One drink equals 12 oz of beer, 5 oz of wine, or 1 oz of hard liquor.  Work with your health care provider to maintain a healthy body weight or to lose weight. Ask what an ideal weight is for you.  Get at least 30 minutes of exercise that causes your heart to beat faster (aerobic exercise) most days of the week. Activities may include walking, swimming, or biking.  Work with your health care provider or diet and nutrition specialist (dietitian) to adjust your eating plan to your individual calorie needs. Reading food labels  Check food labels for the amount of sodium per serving. Choose foods with less than 5 percent of the Daily Value of sodium. Generally, foods with less than 300 mg of sodium per serving fit into this eating plan.  To find whole grains, look for the word "whole" as the first word in the ingredient list. Shopping  Buy products labeled as "low-sodium" or "no salt added."  Buy fresh foods. Avoid canned foods and premade or frozen meals. Cooking  Avoid adding  salt when cooking. Use salt-free seasonings or herbs instead of table salt or sea salt. Check with your health care provider or pharmacist before using salt substitutes.  Do not fry foods. Cook foods using healthy methods such as baking, boiling, grilling, and broiling instead.  Cook with heart-healthy oils, such as olive, canola, soybean, or sunflower oil. Meal planning   Eat a balanced diet that includes: ? 5 or more servings of fruits and vegetables each day. At each meal, try to fill half of your plate with fruits and vegetables. ? Up to 6-8 servings of whole grains each day. ? Less than 6 oz of lean meat, poultry, or fish each day. A 3-oz serving of meat is about the same size as a deck of cards. One egg equals 1 oz. ? 2 servings of low-fat dairy each day. ? A serving of nuts, seeds, or beans 5 times each week. ? Heart-healthy fats. Healthy fats called Omega-3 fatty acids are found in foods such as flaxseeds and coldwater fish, like sardines, salmon, and mackerel.  Limit how much you eat of the following: ? Canned or prepackaged foods. ? Food that is high in trans fat, such as fried foods. ? Food that is high in saturated fat, such as fatty meat. ? Sweets, desserts, sugary drinks, and other foods with added sugar. ? Full-fat dairy products.  Do not salt foods before eating.  Try to eat  at least 2 vegetarian meals each week.  Eat more home-cooked food and less restaurant, buffet, and fast food.  When eating at a restaurant, ask that your food be prepared with less salt or no salt, if possible. What foods are recommended? The items listed may not be a complete list. Talk with your dietitian about what dietary choices are best for you. Grains Whole-grain or whole-wheat bread. Whole-grain or whole-wheat pasta. Brown rice. [place]. Bulgur. Whole-grain and low-sodium cereals. Pita bread. Low-fat, low-sodium crackers. Whole-wheat flour tortillas. Vegetables Fresh or frozen  vegetables (raw, steamed, roasted, or grilled). Low-sodium or reduced-sodium tomato and vegetable juice. Low-sodium or reduced-sodium tomato sauce and tomato paste. Low-sodium or reduced-sodium canned vegetables. Fruits All fresh, dried, or frozen fruit. Canned fruit in natural juice (without added sugar). Meat and other protein foods Skinless chicken or [place]. Ground chicken or [place]. Pork with fat trimmed off. Fish and seafood. Egg whites. Dried beans, peas, or lentils. Unsalted nuts, nut butters, and seeds. Unsalted canned beans. Lean cuts of beef with fat trimmed off. Low-sodium, lean deli meat. Dairy Low-fat (1%) or fat-free (skim) milk. Fat-free, low-fat, or reduced-fat cheeses. Nonfat, low-sodium ricotta or cottage cheese. Low-fat or nonfat yogurt. Low-fat, low-sodium cheese. Fats and oils Soft margarine without trans fats. Vegetable oil. Low-fat, reduced-fat, or light mayonnaise and salad dressings (reduced-sodium). Canola, safflower, olive, soybean, and sunflower oils. Avocado. Seasoning and other foods Herbs. Spices. Seasoning mixes without salt. Unsalted popcorn and pretzels. Fat-free sweets. What foods are not recommended? The items listed may not be a complete list. Talk with your dietitian about what dietary choices are best for you. Grains Baked goods made with fat, such as croissants, muffins, or some breads. Dry pasta or rice meal packs. Vegetables Creamed or fried vegetables. Vegetables in a cheese sauce. Regular canned vegetables (not low-sodium or reduced-sodium). Regular canned tomato sauce and paste (not low-sodium or reduced-sodium). Regular tomato and vegetable juice (not low-sodium or reduced-sodium). Angie Fava. Olives. Fruits Canned fruit in a light or heavy syrup. Fried fruit. Fruit in cream or butter sauce. Meat and other protein foods Fatty cuts of meat. Ribs. Fried meat. Berniece Salines. Sausage. Bologna and other processed lunch meats. Salami. Fatback. Hotdogs. Bratwurst.  Salted nuts and seeds. Canned beans with added salt. Canned or smoked fish. Whole eggs or egg yolks. Chicken or [place] with skin. Dairy Whole or 2% milk, cream, and half-and-half. Whole or full-fat cream cheese. Whole-fat or sweetened yogurt. Full-fat cheese. Nondairy creamers. Whipped toppings. Processed cheese and cheese spreads. Fats and oils Butter. Stick margarine. Lard. Shortening. Ghee. Bacon fat. Tropical oils, such as coconut, palm kernel, or palm oil. Seasoning and other foods Salted popcorn and pretzels. Onion salt, garlic salt, seasoned salt, table salt, and sea salt. Worcestershire sauce. Tartar sauce. Barbecue sauce. Teriyaki sauce. Soy sauce, including reduced-sodium. Steak sauce. Canned and packaged gravies. Fish sauce. Oyster sauce. Cocktail sauce. Horseradish that you find on the shelf. Ketchup. Mustard. Meat flavorings and tenderizers. Bouillon cubes. Hot sauce and Tabasco sauce. Premade or packaged marinades. Premade or packaged taco seasonings. Relishes. Regular salad dressings. Where to find more information:  National Heart, Lung, and [place]: https://wilson-eaton.com/  American Heart Association: www.heart.org Summary  The DASH eating plan is a healthy eating plan that has been shown to reduce high blood pressure (hypertension). It may also reduce your risk for type 2 diabetes, heart disease, and stroke.  With the DASH eating plan, you should limit salt (sodium) intake to 2,300 mg a day. If you have hypertension,  you may need to reduce your sodium intake to 1,500 mg a day.  When on the DASH eating plan, aim to eat more fresh fruits and vegetables, whole grains, lean proteins, low-fat dairy, and heart-healthy fats.  Work with your health care provider or diet and nutrition specialist (dietitian) to adjust your eating plan to your individual calorie needs. This information is not intended to replace advice given to you by your health care provider. Make sure you discuss any  questions you have with your health care provider. Document Released: 06/24/2011 Document Revised: 06/28/2016 Document Reviewed: 06/28/2016 Elsevier Interactive Patient Education  2017 [employer].

## 2017-06-16 NOTE — Assessment & Plan Note (Signed)
Migraines- uses sumatriptan 1-2x a month typically- makes him feel slightly off so usually bears with it. Tylenol can help but he seems to use unusually high dose 1500 mg which I advised against. could trial 600-800 mg ibuprofen trial.

## 2017-06-16 NOTE — Assessment & Plan Note (Signed)
HLD- last year 3.7% 10 year risk, will update lipids and risk score today

## 2017-06-16 NOTE — Assessment & Plan Note (Signed)
Allergic rhinitis- switches back and forth between allegra and claritin every other day

## 2017-06-16 NOTE — Assessment & Plan Note (Signed)
Leukopenia- update cbc with diff. Continue to monitor as long as leukopenia only and other cell lines stable. He states has been like this since 1985

## 2017-06-17 ENCOUNTER — Telehealth: Payer: Self-pay | Admitting: Family Medicine

## 2017-06-17 NOTE — Telephone Encounter (Signed)
Patient called after reviewing lab results on MyChart. Reports he has no questions at this time.

## 2017-07-05 DIAGNOSIS — Z85828 Personal history of other malignant neoplasm of skin: Secondary | ICD-10-CM | POA: Diagnosis not present

## 2017-07-05 DIAGNOSIS — D2271 Melanocytic nevi of right lower limb, including hip: Secondary | ICD-10-CM | POA: Diagnosis not present

## 2017-07-05 DIAGNOSIS — Z86018 Personal history of other benign neoplasm: Secondary | ICD-10-CM | POA: Diagnosis not present

## 2017-07-05 DIAGNOSIS — D225 Melanocytic nevi of trunk: Secondary | ICD-10-CM | POA: Diagnosis not present

## 2017-09-27 DIAGNOSIS — H21233 Degeneration of iris (pigmentary), bilateral: Secondary | ICD-10-CM | POA: Diagnosis not present

## 2017-09-27 DIAGNOSIS — H40013 Open angle with borderline findings, low risk, bilateral: Secondary | ICD-10-CM | POA: Diagnosis not present

## 2017-09-27 DIAGNOSIS — H40053 Ocular hypertension, bilateral: Secondary | ICD-10-CM | POA: Diagnosis not present

## 2017-10-05 DIAGNOSIS — L570 Actinic keratosis: Secondary | ICD-10-CM | POA: Diagnosis not present

## 2017-10-27 ENCOUNTER — Other Ambulatory Visit: Payer: Self-pay | Admitting: Family Medicine

## 2017-11-14 DIAGNOSIS — H019 Unspecified inflammation of eyelid: Secondary | ICD-10-CM | POA: Diagnosis not present

## 2018-01-31 DIAGNOSIS — L570 Actinic keratosis: Secondary | ICD-10-CM | POA: Diagnosis not present

## 2018-03-18 ENCOUNTER — Other Ambulatory Visit: Payer: Self-pay | Admitting: Family Medicine

## 2018-03-30 DIAGNOSIS — H40053 Ocular hypertension, bilateral: Secondary | ICD-10-CM | POA: Diagnosis not present

## 2018-04-04 ENCOUNTER — Encounter: Payer: Self-pay | Admitting: Family Medicine

## 2018-06-19 ENCOUNTER — Encounter: Payer: Self-pay | Admitting: Family Medicine

## 2018-06-19 ENCOUNTER — Ambulatory Visit (INDEPENDENT_AMBULATORY_CARE_PROVIDER_SITE_OTHER): Payer: BLUE CROSS/BLUE SHIELD | Admitting: Family Medicine

## 2018-06-19 VITALS — BP 126/88 | HR 60 | Temp 98.1°F | Ht 70.25 in | Wt 168.8 lb

## 2018-06-19 DIAGNOSIS — E785 Hyperlipidemia, unspecified: Secondary | ICD-10-CM

## 2018-06-19 DIAGNOSIS — D72819 Decreased white blood cell count, unspecified: Secondary | ICD-10-CM

## 2018-06-19 DIAGNOSIS — Z23 Encounter for immunization: Secondary | ICD-10-CM

## 2018-06-19 DIAGNOSIS — Z125 Encounter for screening for malignant neoplasm of prostate: Secondary | ICD-10-CM

## 2018-06-19 DIAGNOSIS — Z0000 Encounter for general adult medical examination without abnormal findings: Secondary | ICD-10-CM

## 2018-06-19 LAB — COMPREHENSIVE METABOLIC PANEL
ALT: 19 U/L (ref 0–53)
AST: 22 U/L (ref 0–37)
Albumin: 4.4 g/dL (ref 3.5–5.2)
Alkaline Phosphatase: 47 U/L (ref 39–117)
BUN: 17 mg/dL (ref 6–23)
CALCIUM: 9.2 mg/dL (ref 8.4–10.5)
CO2: 30 meq/L (ref 19–32)
CREATININE: 1.13 mg/dL (ref 0.40–1.50)
Chloride: 103 mEq/L (ref 96–112)
GFR: 71.19 mL/min (ref >60.00)
Glucose, Bld: 104 mg/dL — ABNORMAL HIGH (ref 70–99)
Potassium: 4.1 mEq/L (ref 3.5–5.1)
Sodium: 140 mEq/L (ref 135–145)
Total Bilirubin: 0.7 mg/dL (ref 0.2–1.2)
Total Protein: 6.7 g/dL (ref 6.0–8.3)

## 2018-06-19 LAB — CBC WITH DIFFERENTIAL/PLATELET
Basophils Absolute: 0 10*3/uL (ref 0.0–0.1)
Basophils Relative: 0.9 % (ref 0.0–3.0)
Eosinophils Absolute: 0.1 10*3/uL (ref 0.0–0.7)
Eosinophils Relative: 1.5 % (ref 0.0–5.0)
HCT: 42.3 % (ref 39.0–52.0)
HEMOGLOBIN: 14.2 g/dL (ref 13.0–17.0)
Lymphocytes Relative: 20.2 % (ref 12.0–46.0)
Lymphs Abs: 0.8 10*3/uL (ref 0.7–4.0)
MCHC: 33.5 g/dL (ref 30.0–36.0)
MCV: 93.5 fl (ref 78.0–100.0)
Monocytes Absolute: 0.4 10*3/uL (ref 0.1–1.0)
Monocytes Relative: 10 % (ref 3.0–12.0)
Neutro Abs: 2.6 10*3/uL (ref 1.4–7.7)
Neutrophils Relative %: 67.4 % (ref 43.0–77.0)
Platelets: 164 10*3/uL (ref 150.0–400.0)
RBC: 4.53 Mil/uL (ref 4.22–5.81)
RDW: 13.1 % (ref 11.5–15.5)
WBC: 3.9 10*3/uL — ABNORMAL LOW (ref 4.0–10.5)

## 2018-06-19 LAB — LIPID PANEL
Cholesterol: 216 mg/dL — ABNORMAL HIGH (ref 0–200)
HDL: 71 mg/dL (ref >39.00)
LDL CALC: 133 mg/dL — AB (ref 0–99)
NonHDL: 144.5
TRIGLYCERIDES: 60 mg/dL (ref 0.0–149.0)
Total CHOL/HDL Ratio: 3
VLDL: 12 mg/dL (ref 0.0–40.0)

## 2018-06-19 LAB — PSA: PSA: 0.52 ng/mL (ref 0.10–4.00)

## 2018-06-19 NOTE — Patient Instructions (Addendum)
Health Maintenance Due  Topic Date Due  . TETANUS/TDAP -today before you leave 07/27/2017   Shingrix #1 today. Repeat injection in 2-5 months. Schedule this before you leave at check out  Keep up the fantastic job with exercise and healthy eating  Blood pressure looks better- have to be careful with the decongestants as this can raise BP  Please stop by lab before you go

## 2018-06-19 NOTE — Addendum Note (Signed)
Addended by: Lyndle Herrlich on: 06/19/2018 09:22 AM   Modules accepted: Orders

## 2018-06-19 NOTE — Progress Notes (Signed)
Phone: [phone]  Subjective:  Patient presents today for their annual physical. Chief complaint-noted.   See problem oriented charting- ROS- full  review of systems was completed and negative except for: seasonal allergies  The following were reviewed and entered/updated in epic: Past Medical History:  Diagnosis Date  . Adenomatous colon polyp    2013, 5 year repeat  . Allergy   . Hyperlipidemia    no rx. total just above 200, HDL great, LDL in 120s 130s  . Leukopenia    stable in the 3s  . Migraine    relpax about once a month   Patient Active Problem List   Diagnosis Date Noted  . Migraine     Priority: Medium  . Hyperlipidemia     Priority: Medium  . Adenomatous colon polyp     Priority: Low  . Leukopenia     Priority: Low  . Allergic rhinitis 06/16/2017   Past Surgical History:  Procedure Laterality Date  . ANAL FISSURE REPAIR  2007  . COLONOSCOPY W/ POLYPECTOMY    . HERNIA REPAIR     as infant; bilateral  . VASECTOMY      Family History  Problem Relation Age of Onset  . Colon cancer Maternal Grandfather        early 42s  . Lung cancer Mother        smoker, died 53 age 94  . Heart failure Father        rheumatic fever-died 23 months after mother, did not take care of himself  . Stomach cancer Neg Hx     Medications- reviewed and updated Current Outpatient Medications  Medication Sig Dispense Refill  . fexofenadine (ALLEGRA) 180 MG tablet Take 180 mg by mouth daily.    . Ginseng 500 MG TABS Take 1 tablet by mouth daily.    Marland Kitchen loratadine-pseudoephedrine (CLARITIN-D 12-HOUR) 5-120 MG per tablet Take 1 tablet by mouth daily.    . SUMAtriptan (IMITREX) 50 MG tablet TAKE 1 TABLET BY MOUTH EVERY 2 HOURS AS NEEDED FOR MIGRAINE. MAY REPEAT IN 2 HOURS ONCE IF HEADACHE PERSISTS OR RECURS 10 tablet 1   No current facility-administered medications for this visit.     Allergies-reviewed and updated No Known Allergies  Social History   Social History  Narrative   Married (wife patient of Dr. 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    Prior Worked at [employer]: running, biking, golf    Objective: BP 126/88 (BP Location: Left Arm, Patient Position: Sitting, Cuff Size: Large)   Pulse 60   Temp 98.1 F (36.7 C) (Oral)   Ht 5' 10.25" (1.784 m)   Wt 168 lb 12.8 oz (76.6 kg)   SpO2 95%   BMI 24.05 kg/m  Gen: NAD, resting comfortably, athletic build HEENT: Mucous membranes are moist. Oropharynx normal Neck: no thyromegaly CV: RRR no murmurs rubs or gallops Lungs: CTAB no crackles, wheeze, rhonchi Abdomen: soft/nontender/nondistended/normal bowel sounds. No rebound or guarding.  Ext: no edema Skin: warm, dry Neuro: grossly normal, moves all extremities, PERRLA  Assessment/Plan:  57 y.o. male presenting for annual physical.  Health Maintenance counseling: 1. Anticipatory guidance: Patient counseled regarding regular dental exams -q6  months, eye exams -q6-words preglaucoma have been used intermittently- will continue to be monitored- most recent was told not even preglaucoma ,  avoiding smoking and second hand smoke, limiting alcohol to 2 beverages per day.   2. Risk factor reduction:  Advised patient of need for regular exercise and diet rich and fruits and vegetables to reduce risk of heart attack and stroke. Exercise-last year was using Bowflex 2 to 3 days a week and running 2 to 3 days a week- at present,has kept this up- averages 5 days a week. Diet-reasonably balanced/mindful diet.  Wt Readings from Last 3 Encounters:  06/19/18 168 lb 12.8 oz (76.6 kg)  06/16/17 170 lb 3.2 oz (77.2 kg)  05/19/17 167 lb (75.8 kg)  3. Immunizations/screenings/ancillary studies-Tdap given  today  Immunization History  Administered Date(s) Administered  . Influenza Split 04/10/2012, 03/28/2013  . Influenza,inj,Quad PF,6+ Mos 05/09/2015, 03/18/2016, 05/18/2018  . Influenza-Unspecified 04/18/2013, 05/13/2014, 04/11/2017, 05/18/2018  . Td 07/28/2007  4. Prostate cancer screening- low risk PSA trend in the past.  Defer rectal unless PSA  trending up significantly Lab Results  Component Value Date   PSA 0.53 06/16/2017   PSA 0.52 03/02/2016   PSA 0.51 02/14/2015   5. Colon cancer screening - 05/19/2017 and now has been cleared for 10-year follow-up.  Previous 5-year follow-up due to adenomatous colon polyp history. 6. Skin cancer screening-sees Dr. Delman Cheadle every 6 to 12 months- sees before end of year. advised regular sunscreen use. Denies worrisome, changing, or new skin lesions.  7.  Never smoker  Status of chronic or acute concerns   We have been monitoring his blood pressure as has been trending up- he states cut back on gingseng - daughter recommended this and seems to have helped at least with systolic #.  BP Readings from Last 3 Encounters:  06/19/18 126/88  06/16/17 138/88  05/19/17 (!) 142/91   Hyperlipidemia-10-year risk was under 4% last year-update lipids and risk score. He prefers to stay off medication.    Migraines- still using sumatriptan about 1-2 times a month typically.  He feels slightly "off" on the medication but does significantly help the headache- heightens senses basically.  He had been using 1500mg  Tylenol at times-I advised instead to trial 600-800 mg ibuprofen as needed if he preferred over-the-counter trial- he continues to use the 1500 mg- advised against again.   Leukopenia- continue to update CBC.  Very mild decrease in numbers in the threes-this appears to be his baseline- states has been like this since his 53s   Allergic rhinitis- he will use Allegra (4 days a week) and Claritin-d (3 days a week)- we discussed the D can raise BP- has really  helped him so wants to continue  Return in about 1 year (around 06/20/2019) for physical.  Lab/Order associations: FASTING  Preventative health care - Plan: Lipid panel, Comprehensive metabolic panel, PSA, CBC with Differential/Platelet, CANCELED: CBC  Hyperlipidemia, unspecified hyperlipidemia type - Plan: Lipid panel, Comprehensive metabolic panel, CBC with Differential/Platelet, CANCELED: CBC  Screening for prostate cancer - Plan: PSA  Leukopenia, unspecified type - Plan: CBC with Differential/Platelet  Return precautions advised.  Garret Reddish, MD

## 2018-06-20 ENCOUNTER — Encounter: Payer: Self-pay | Admitting: Family Medicine

## 2018-07-03 ENCOUNTER — Other Ambulatory Visit: Payer: Self-pay | Admitting: Family Medicine

## 2018-07-04 DIAGNOSIS — Z85828 Personal history of other malignant neoplasm of skin: Secondary | ICD-10-CM | POA: Diagnosis not present

## 2018-07-04 DIAGNOSIS — D225 Melanocytic nevi of trunk: Secondary | ICD-10-CM | POA: Diagnosis not present

## 2018-07-04 DIAGNOSIS — Z86018 Personal history of other benign neoplasm: Secondary | ICD-10-CM | POA: Diagnosis not present

## 2018-07-04 DIAGNOSIS — L570 Actinic keratosis: Secondary | ICD-10-CM | POA: Diagnosis not present

## 2018-08-11 ENCOUNTER — Telehealth: Payer: BLUE CROSS/BLUE SHIELD | Admitting: Family

## 2018-08-11 DIAGNOSIS — J00 Acute nasopharyngitis [common cold]: Secondary | ICD-10-CM

## 2018-08-11 MED ORDER — BENZONATATE 100 MG PO CAPS: 100.0000 mg | ORAL_CAPSULE | Freq: Three times a day (TID) | ORAL | 0 refills | Status: DC | PRN

## 2018-08-11 MED ORDER — FLUTICASONE PROPIONATE 50 MCG/ACT NA SUSP: 1.0000 | Freq: Two times a day (BID) | NASAL | 6 refills | Status: DC

## 2018-08-11 NOTE — Progress Notes (Signed)
Thank you for the details you included in the comment boxes. Those details are very helpful in determining the best course of treatment for you and help [place] to provide the best care.  We are sorry you are not feeling well.  Here is how we plan to help!  Based on what you have shared with me, it looks like you may have a viral upper respiratory infection or a "common cold".  Colds are caused by a large number of viruses; however, rhinovirus is the most common cause.   Symptoms of the common cold vary from person to person, with common symptoms including sore throat, cough, and malaise.  A low-grade fever of 100.4 may present, but is often uncommon.  Symptoms vary however, and are closely related to a person's age or underlying illnesses.  The most common symptoms associated with the common cold are nasal discharge or congestion, cough, sneezing, headache and pressure in the ears and face.  Cold symptoms usually persist for about 3 to 10 days, but can last up to 2 weeks.  It is important to know that colds do not cause serious illness or complications in most cases.    The common cold is transmitted from person to person, with the most common method of transmission being a person's hands.  The virus is able to live on the skin and can infect other persons for up to 2 hours after direct contact.  Also, colds are transmitted when someone coughs or sneezes; thus, it is important to cover the mouth to reduce this risk.  To keep the spread of the common cold at [place], good hand hygiene is very important.  This is an infection that is most likely caused by a virus. There are no specific treatments for the common cold other than to help you with the symptoms until the infection runs its course.    For nasal congestion, you may use an oral decongestants such as Mucinex D or if you have glaucoma or high blood pressure use plain Mucinex.  Saline nasal spray or nasal drops can help and can safely be used as often as  needed for congestion.  For your congestion, I have prescribed Fluticasone nasal spray one spray in each nostril twice a day  If you do not have a history of heart disease, hypertension, diabetes or thyroid disease, prostate/bladder issues or glaucoma, you may also use Sudafed to treat nasal congestion.  It is highly recommended that you consult with a pharmacist or your primary care physician to ensure this medication is safe for you to take.     If you have a cough, you may use cough suppressants such as Delsym and Robitussin.  If you have glaucoma or high blood pressure, you can also use Coricidin HBP.   For cough I have prescribed for you A prescription cough medication called Tessalon Perles 100 mg. You may take 1-2 capsules every 8 hours as needed for cough  If you have a sore or scratchy throat, use a saltwater gargle-  to  teaspoon of salt dissolved in a 4-ounce to 8-ounce glass of warm water.  Gargle the solution for approximately 15-30 seconds and then spit.  It is important not to swallow the solution.  You can also use throat lozenges/cough drops and Chloraseptic spray to help with throat pain or discomfort.  Warm or cold liquids can also be helpful in relieving throat pain.  For headache, pain or general discomfort, you can use Ibuprofen or Tylenol  as directed.   Some authorities believe that zinc sprays or the use of Echinacea may shorten the course of your symptoms.   HOME CARE . Only take medications as instructed by your medical team. . Be sure to drink plenty of fluids. Water is fine as well as fruit juices, sodas and electrolyte beverages. You may want to stay away from caffeine or alcohol. If you are nauseated, try taking small sips of liquids. How do you know if you are getting enough fluid? Your urine should be a pale yellow or almost colorless. . Get rest. . Taking a steamy shower or using a humidifier may help nasal congestion and ease sore throat pain. You can place a  towel over your head and breathe in the steam from hot water coming from a faucet. . Using a saline nasal spray works much the same way. . Cough drops, hard candies and sore throat lozenges may ease your cough. . Avoid close contacts especially the very young and the elderly . Cover your mouth if you cough or sneeze . Always remember to wash your hands.   GET HELP RIGHT AWAY IF: . You develop worsening fever. . If your symptoms do not improve within 10 days . You develop yellow or green discharge from your nose over 3 days. . You have coughing fits . You develop a severe head ache or visual changes. . You develop shortness of breath, difficulty breathing or start having chest pain . Your symptoms persist after you have completed your treatment plan  MAKE SURE YOU   Understand these instructions.  Will watch your condition.  Will get help right away if you are not doing well or get worse.  Your e-visit answers were reviewed by a board certified advanced clinical practitioner to complete your personal care plan. Depending upon the condition, your plan could have included both over the counter or prescription medications. Please review your pharmacy choice. If there is a problem, you may call our nursing hot line at and have the prescription routed to another pharmacy. Your safety is important to [place]. If you have drug allergies check your prescription carefully.   You can use MyChart to ask questions about today's visit, request a non-urgent call back, or ask for a work or school excuse for 24 hours related to this e-Visit. If it has been greater than 24 hours you will need to follow up with your provider, or enter a new e-Visit to address those concerns. You will get an e-mail in the next two days asking about your experience.  I hope that your e-visit has been valuable and will speed your recovery. Thank you for using e-visits.

## 2018-09-28 ENCOUNTER — Ambulatory Visit (INDEPENDENT_AMBULATORY_CARE_PROVIDER_SITE_OTHER): Payer: BLUE CROSS/BLUE SHIELD

## 2018-09-28 ENCOUNTER — Other Ambulatory Visit: Payer: Self-pay

## 2018-09-28 DIAGNOSIS — Z23 Encounter for immunization: Secondary | ICD-10-CM

## 2018-09-28 NOTE — Progress Notes (Signed)
Per orders of Dr. Yong Channel, injection of 2nd Shingrix given in R deltoid by Franco Collet. Patient tolerated injection well.

## 2018-10-14 ENCOUNTER — Other Ambulatory Visit: Payer: Self-pay | Admitting: Family Medicine

## 2018-11-30 ENCOUNTER — Telehealth: Payer: BLUE CROSS/BLUE SHIELD | Admitting: Family

## 2018-11-30 DIAGNOSIS — L237 Allergic contact dermatitis due to plants, except food: Secondary | ICD-10-CM

## 2018-11-30 MED ORDER — PREDNISONE 20 MG PO TABS: 20.0000 mg | ORAL_TABLET | Freq: Every day | ORAL | 0 refills | Status: DC

## 2018-11-30 NOTE — Progress Notes (Signed)
Greater than 5 minutes, yet less than 10 minutes of time have been spent researching, coordinating, and implementing care for this patient today.  Thank you for the details you included in the comment boxes. Those details are very helpful in determining the best course of treatment for you and help [place] to provide the best care.  We are sorry that you are not feeing well.  Here is how we plan to help!  Based on what you have shared with me it looks like you have had an allergic reaction to the oily resin from a group of plants.  This resin is very sticky, so it easily attaches to your skin, clothing, tools equipment, and pet's fur.    This blistering rash is often called poison ivy rash although it can come from contact with the leaves, stems and roots of poison ivy, poison oak and poison sumac.  The oily resin contains urushiol (u-ROO-she-ol) that produces a skin rash on exposed skin.  The severity of the rash depends on the amount of urushiol that gets on your skin.  A section of skin with more urushiol on it may develop a rash sooner.  The rash usually develops 12-48 hours after exposure and can last two to three weeks.  Your skin must come in direct contact with the plant's oil to be affected.  Blister fluid doesn't spread the rash.  However, if you come into contact with a piece of clothing or pet fur that has urushiol on it, the rash may spread out.  You can also transfer the oil to other parts of your body with your fingers.  Often the rash looks like a straight line because of the way the plant brushes against your skin.    I have developed the following plan to treat your condition Since your rash is widespread or has resulted in a large number of blisters, I have prescribed an oral corticosteroid.  Please follow these recommendations:  I have sent a prednisone dose pack to your chosen pharmacy. Be sure to follow the instructions carefully and complete the entire prescription. You may use  Benadryl or Caladryl topical lotions to sooth the itch and remember cool, not hot, showers and baths can help relieve the itching!  Place cool, wet compresses on the affected area for 15-30 minutes several times a day.  You may also take oral antihistamines, such as diphenhydramine (Benadryl, others), which may also help you sleep better.  Watch your skin for any purulent (pus) drainage or red streaking from the site.  If this occurs, contact your provider.  You may require an antibiotic for a skin infection.  Make sure that the clothes you were wearing as well as any towels or sheets that may have come in contact with the oil (urushiol) are washed in detergent and hot water.         I have sent prednisone 20mg  daily for 5 days. As far as sleep is concerned, we cannot prescribe any meds that would help with sleep via the e-visit program. I would suggest magnesium 500mg  at night as a natural supplement that helps tremendously. It works in the same way many sleep meds work (through the [place]) but without addiction or breathing risks.   What can you do to prevent this rash?  Avoid the plants.  Learn how to identify poison ivy, poison oak and poison sumac in all seasons.  When hiking or engaging in other activities that might expose you to these  plants, try to stay on cleared pathways.  If camping, make sure you pitch your tent in an area free of these plants.  Keep pets from running through wooded areas so that urushiol doesn't accidentally stick to their fur, which you may touch.  Remove or kill the plants.  In your yard, you can get rid of poison ivy by applying an herbicide or pulling it out of the ground, including the roots, while wearing heavy gloves.  Afterward remove the gloves and thoroughly wash them and your hands.  Don't burn poison ivy or related plants because the urushiol can be carried by smoke.  Wear protective clothing.  If needed, protect your skin by wearing socks, boots, pants,  long sleeves and vinyl gloves.  Wash your skin right away.  Washing off the oil with soap and water within 30 minutes of exposure may reduce your chances of getting a poison ivy rash.  Even washing after an hour or so can help reduce the severity of the rash.  If you walk through some poison ivy and then later touch your shoes, you may get some urushiol on your hands, which may then transfer to your face or body by touching or rubbing.  If the contaminated object isn't cleaned, the urushiol on it can still cause a skin reaction years later.    Be careful not to reuse towels after you have washed your skin.  Also carefully wash clothing in detergent and hot water to remove all traces of the oil.  Handle contaminated clothing carefully so you don't transfer the urushiol to yourself, furniture, rugs or appliances.  Remember that pets can carry the oil on their fur and paws.  If you think your pet may be contaminated with urushiol, put on some long rubber gloves and give your pet a bath.  Finally, be careful not to burn these plants as the smoke can contain traces of the oil.  Inhaling the smoke may result in difficulty breathing. If that occurred you should see a physician as soon as possible.  See your doctor right away if:   The reaction is severe or widespread  You inhaled the smoke from burning poison ivy and are having difficulty breathing  Your skin continues to swell  The rash affects your eyes, mouth or genitals  Blisters are oozing pus  You develop a fever greater than 100 F (37.8 C)  The rash doesn't get better within a few weeks.  If you scratch the poison ivy rash, bacteria under your fingernails may cause the skin to become infected.  See your doctor if pus starts oozing from the blisters.  Treatment generally includes antibiotics.  Poison ivy treatments are usually limited to self-care methods.  And the rash typically goes away on its own in two to three weeks.     If  the rash is widespread or results in a large number of blisters, your doctor may prescribe an oral corticosteroid, such as prednisone.  If a bacterial infection has developed at the rash site, your doctor may give you a prescription for an oral antibiotic.  MAKE SURE YOU   Understand these instructions.  Will watch your condition.  Will get help right away if you are not doing well or get worse.  Thank you for choosing an e-visit. Your e-visit answers were reviewed by a board certified advanced clinical practitioner to complete your personal care plan. Depending upon the condition, your plan could have included both over the counter or  prescription medications.  Please review your pharmacy choice. If there is a problem you may use MyChart messaging to have the prescription routed to another pharmacy.   Your safety is important to [place]. If you have drug allergies check your prescription carefully.  You can use MyChart to ask questions about today's visit, request a non-urgent call back, or ask for a work or school excuse for 24 hours related to this e-Visit. If it has been greater than 24 hours you will need to follow up with your provider, or enter a new e-Visit to address those concerns.   You will get an email in the next two days asking about your experience. I hope that your e-visit has been valuable and will speed your recovery Thank you for choosing an e-visit.

## 2019-01-03 DIAGNOSIS — Z86018 Personal history of other benign neoplasm: Secondary | ICD-10-CM | POA: Diagnosis not present

## 2019-01-03 DIAGNOSIS — D2271 Melanocytic nevi of right lower limb, including hip: Secondary | ICD-10-CM | POA: Diagnosis not present

## 2019-01-03 DIAGNOSIS — D225 Melanocytic nevi of trunk: Secondary | ICD-10-CM | POA: Diagnosis not present

## 2019-01-03 DIAGNOSIS — Z85828 Personal history of other malignant neoplasm of skin: Secondary | ICD-10-CM | POA: Diagnosis not present

## 2019-01-28 ENCOUNTER — Other Ambulatory Visit: Payer: Self-pay | Admitting: Family Medicine

## 2019-01-31 DIAGNOSIS — H40053 Ocular hypertension, bilateral: Secondary | ICD-10-CM | POA: Diagnosis not present

## 2019-01-31 DIAGNOSIS — H40013 Open angle with borderline findings, low risk, bilateral: Secondary | ICD-10-CM | POA: Diagnosis not present

## 2019-04-03 DIAGNOSIS — M25521 Pain in right elbow: Secondary | ICD-10-CM | POA: Diagnosis not present

## 2019-04-05 DIAGNOSIS — M6281 Muscle weakness (generalized): Secondary | ICD-10-CM | POA: Diagnosis not present

## 2019-04-05 DIAGNOSIS — M25521 Pain in right elbow: Secondary | ICD-10-CM | POA: Diagnosis not present

## 2019-04-05 DIAGNOSIS — M7701 Medial epicondylitis, right elbow: Secondary | ICD-10-CM | POA: Diagnosis not present

## 2019-04-07 ENCOUNTER — Other Ambulatory Visit: Payer: Self-pay | Admitting: Family Medicine

## 2019-04-10 DIAGNOSIS — M25521 Pain in right elbow: Secondary | ICD-10-CM | POA: Diagnosis not present

## 2019-04-10 DIAGNOSIS — M7701 Medial epicondylitis, right elbow: Secondary | ICD-10-CM | POA: Diagnosis not present

## 2019-04-10 DIAGNOSIS — M6281 Muscle weakness (generalized): Secondary | ICD-10-CM | POA: Diagnosis not present

## 2019-04-12 DIAGNOSIS — M7701 Medial epicondylitis, right elbow: Secondary | ICD-10-CM | POA: Diagnosis not present

## 2019-04-12 DIAGNOSIS — M25521 Pain in right elbow: Secondary | ICD-10-CM | POA: Diagnosis not present

## 2019-04-12 DIAGNOSIS — M6281 Muscle weakness (generalized): Secondary | ICD-10-CM | POA: Diagnosis not present

## 2019-04-17 DIAGNOSIS — M25521 Pain in right elbow: Secondary | ICD-10-CM | POA: Diagnosis not present

## 2019-04-17 DIAGNOSIS — M7701 Medial epicondylitis, right elbow: Secondary | ICD-10-CM | POA: Diagnosis not present

## 2019-04-17 DIAGNOSIS — M6281 Muscle weakness (generalized): Secondary | ICD-10-CM | POA: Diagnosis not present

## 2019-04-19 DIAGNOSIS — M6281 Muscle weakness (generalized): Secondary | ICD-10-CM | POA: Diagnosis not present

## 2019-04-19 DIAGNOSIS — M7701 Medial epicondylitis, right elbow: Secondary | ICD-10-CM | POA: Diagnosis not present

## 2019-04-19 DIAGNOSIS — M25521 Pain in right elbow: Secondary | ICD-10-CM | POA: Diagnosis not present

## 2019-04-27 DIAGNOSIS — M7701 Medial epicondylitis, right elbow: Secondary | ICD-10-CM | POA: Diagnosis not present

## 2019-04-27 DIAGNOSIS — M25521 Pain in right elbow: Secondary | ICD-10-CM | POA: Diagnosis not present

## 2019-04-27 DIAGNOSIS — M6281 Muscle weakness (generalized): Secondary | ICD-10-CM | POA: Diagnosis not present

## 2019-05-01 DIAGNOSIS — M6281 Muscle weakness (generalized): Secondary | ICD-10-CM | POA: Diagnosis not present

## 2019-05-01 DIAGNOSIS — M25521 Pain in right elbow: Secondary | ICD-10-CM | POA: Diagnosis not present

## 2019-05-01 DIAGNOSIS — M7701 Medial epicondylitis, right elbow: Secondary | ICD-10-CM | POA: Diagnosis not present

## 2019-05-12 ENCOUNTER — Other Ambulatory Visit: Payer: Self-pay | Admitting: Family Medicine

## 2019-05-15 ENCOUNTER — Other Ambulatory Visit: Payer: Self-pay | Admitting: Orthopaedic Surgery

## 2019-05-15 DIAGNOSIS — M25521 Pain in right elbow: Secondary | ICD-10-CM

## 2019-06-04 ENCOUNTER — Ambulatory Visit
Admission: RE | Admit: 2019-06-04 | Discharge: 2019-06-04 | Disposition: A | Discharge status: Home / Self Care | Payer: BC Managed Care – PPO | Source: Ambulatory Visit | Attending: Orthopaedic Surgery | Admitting: Orthopaedic Surgery

## 2019-06-04 ENCOUNTER — Other Ambulatory Visit: Payer: Self-pay

## 2019-06-04 DIAGNOSIS — S56211A Strain of other flexor muscle, fascia and tendon at forearm level, right arm, initial encounter: Secondary | ICD-10-CM | POA: Diagnosis not present

## 2019-06-04 DIAGNOSIS — M25521 Pain in right elbow: Secondary | ICD-10-CM | POA: Diagnosis not present

## 2019-06-21 ENCOUNTER — Encounter: Payer: Self-pay | Admitting: Family Medicine

## 2019-06-21 ENCOUNTER — Ambulatory Visit (INDEPENDENT_AMBULATORY_CARE_PROVIDER_SITE_OTHER): Payer: BC Managed Care – PPO | Admitting: Family Medicine

## 2019-06-21 ENCOUNTER — Other Ambulatory Visit: Payer: Self-pay

## 2019-06-21 VITALS — BP 142/92 | HR 49 | Temp 97.9°F | Ht 70.25 in | Wt 171.2 lb

## 2019-06-21 DIAGNOSIS — E785 Hyperlipidemia, unspecified: Secondary | ICD-10-CM | POA: Diagnosis not present

## 2019-06-21 DIAGNOSIS — Z125 Encounter for screening for malignant neoplasm of prostate: Secondary | ICD-10-CM | POA: Diagnosis not present

## 2019-06-21 DIAGNOSIS — D72819 Decreased white blood cell count, unspecified: Secondary | ICD-10-CM

## 2019-06-21 DIAGNOSIS — Z0000 Encounter for general adult medical examination without abnormal findings: Secondary | ICD-10-CM

## 2019-06-21 DIAGNOSIS — D126 Benign neoplasm of colon, unspecified: Secondary | ICD-10-CM | POA: Diagnosis not present

## 2019-06-21 DIAGNOSIS — J301 Allergic rhinitis due to pollen: Secondary | ICD-10-CM

## 2019-06-21 DIAGNOSIS — G43909 Migraine, unspecified, not intractable, without status migrainosus: Secondary | ICD-10-CM

## 2019-06-21 LAB — CBC WITH DIFFERENTIAL/PLATELET
Basophils Absolute: 0 10*3/uL (ref 0.0–0.1)
Basophils Relative: 0.8 % (ref 0.0–3.0)
Eosinophils Absolute: 0.1 10*3/uL (ref 0.0–0.7)
Eosinophils Relative: 2.9 % (ref 0.0–5.0)
HCT: 43.7 % (ref 39.0–52.0)
Hemoglobin: 14.7 g/dL (ref 13.0–17.0)
Lymphocytes Relative: 24.9 % (ref 12.0–46.0)
Lymphs Abs: 0.9 10*3/uL (ref 0.7–4.0)
MCHC: 33.6 g/dL (ref 30.0–36.0)
MCV: 93.9 fl (ref 78.0–100.0)
Monocytes Absolute: 0.4 10*3/uL (ref 0.1–1.0)
Monocytes Relative: 9.7 % (ref 3.0–12.0)
Neutro Abs: 2.3 10*3/uL (ref 1.4–7.7)
Neutrophils Relative %: 61.7 % (ref 43.0–77.0)
Platelets: 170 10*3/uL (ref 150.0–400.0)
RBC: 4.65 Mil/uL (ref 4.22–5.81)
RDW: 13.2 % (ref 11.5–15.5)
WBC: 3.7 10*3/uL — ABNORMAL LOW (ref 4.0–10.5)

## 2019-06-21 LAB — COMPREHENSIVE METABOLIC PANEL
ALT: 21 U/L (ref 0–53)
AST: 19 U/L (ref 0–37)
Albumin: 4.4 g/dL (ref 3.5–5.2)
Alkaline Phosphatase: 51 U/L (ref 39–117)
BUN: 16 mg/dL (ref 6–23)
CO2: 31 mEq/L (ref 19–32)
Calcium: 9.2 mg/dL (ref 8.4–10.5)
Chloride: 103 mEq/L (ref 96–112)
Creatinine, Ser: 0.99 mg/dL (ref 0.40–1.50)
GFR: 77.75 mL/min (ref >60.00)
Glucose, Bld: 96 mg/dL (ref 70–99)
Potassium: 4.6 mEq/L (ref 3.5–5.1)
Sodium: 140 mEq/L (ref 135–145)
Total Bilirubin: 0.7 mg/dL (ref 0.2–1.2)
Total Protein: 6.7 g/dL (ref 6.0–8.3)

## 2019-06-21 LAB — PSA: PSA: 0.53 ng/mL (ref 0.10–4.00)

## 2019-06-21 LAB — LIPID PANEL
Cholesterol: 237 mg/dL — ABNORMAL HIGH (ref 0–200)
HDL: 69.8 mg/dL (ref >39.00)
LDL Cholesterol: 158 mg/dL — ABNORMAL HIGH (ref 0–99)
NonHDL: 167.01
Total CHOL/HDL Ratio: 3
Triglycerides: 47 mg/dL (ref 0.0–149.0)
VLDL: 9.4 mg/dL (ref 0.0–40.0)

## 2019-06-21 NOTE — Patient Instructions (Addendum)
Consider neil med sinus rinse  - decongestant may raise blood pressure- if blood pressure is high at home would be great to try to come off of these  - if cant come off decongestant and BP stays high- we may need to consider medicine to help blood pressure   I would like for you to buy/use a home cuff to check at least 4x a week. Your goal is 138/88 or less on average. Please get 2-3 weeks of readings and then send me a message.   We will call you within two weeks about your referral to neurology for migraines. If you do not hear within 3 weeks, give [place] a call.   Recommended follow up: Return in about 1 year (around 06/20/2020) for physical or sooner if needed. such as if BP elevated  Please stop by lab before you go If you do not have mychart- we will call you about results within 5 business days of [place] receiving them.  If you have mychart- we will send your results within 3 business days of [place] receiving them.  If abnormal or we want to clarify a result, we will call or mychart you to make sure you receive the message.  If you have questions or concerns or don't hear within 5-7 days, please send [place] a message or call us.

## 2019-06-21 NOTE — Progress Notes (Signed)
Phone: [phone]   Subjective:  Patient presents today for their annual physical. Chief complaint-noted.   See problem oriented charting- ROS- full  review of systems was completed and negative  except for: seasonal allergies and headaches.  The following were reviewed and entered/updated in epic: Past Medical History:  Diagnosis Date  . Adenomatous colon polyp    2013, 5 year repeat  . Allergy   . Hyperlipidemia    no rx. total just above 200, HDL great, LDL in 120s 130s  . Leukopenia    stable in the 3s  . Migraine    relpax about once a month   Patient Active Problem List   Diagnosis Date Noted  . Migraine     Priority: Medium  . Hyperlipidemia     Priority: Medium  . Adenomatous colon polyp     Priority: Low  . Leukopenia     Priority: Low  . Allergic rhinitis 06/16/2017   Past Surgical History:  Procedure Laterality Date  . ANAL FISSURE REPAIR  2007  . COLONOSCOPY W/ POLYPECTOMY    . HERNIA REPAIR     as infant; bilateral  . VASECTOMY      Family History  Problem Relation Age of Onset  . Colon cancer Maternal Grandfather        early 60s  . Lung cancer Mother        smoker, died 18 age 64  . Heart failure Father        rheumatic fever-died 23 months after mother, did not take care of himself  . Stomach cancer Neg Hx     Medications- reviewed and updated Current Outpatient Medications  Medication Sig Dispense Refill  . fexofenadine (ALLEGRA) 180 MG tablet Take 180 mg by mouth daily.    . Ginseng 500 MG TABS Take 1 tablet by mouth daily.    Marland Kitchen loratadine-pseudoephedrine (CLARITIN-D 12-HOUR) 5-120 MG per tablet Take 1 tablet by mouth daily.    . SUMAtriptan (IMITREX) 50 MG tablet TAKE 1 TABLET BY MOUTH EVERY 2 HOURS AS NEEDED FOR MIGRAINE. MAY REPEAT IN 2 HOURS ONCE IF HEADACHE PERSISTS OR RECURS 10 tablet 0   No current facility-administered medications for this visit.     Allergies-reviewed and updated No Known Allergies  Social History   Social History Narrative   Married (wife patient of Dr. 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    Prior Worked at [employer]: running, biking, golf   Objective  Objective:  BP (!) 160/82   Pulse (!) 49   Temp 97.9 F (36.6 C)   Ht 5' 10.25" (1.784 m)   Wt 171 lb 3.2 oz (77.7 kg)   SpO2 97%   BMI 24.39 kg/m  Gen: NAD, resting comfortably HEENT: Mask not removed due to covid 19. TM normal. Bridge of nose normal. Eyelids normal.  Neck: no thyromegaly or cervical lymphadenopathy   CV: RRR no murmurs rubs or gallops Lungs: CTAB no crackles, wheeze, rhonchi Abdomen: soft/nontender/nondistended/normal bowel sounds. No rebound or guarding.  Ext: no edema Skin: warm, dry Neuro: grossly normal, moves all extremities, PERRLA    Assessment and Plan  58 y.o. male presenting for annual physical.  Health Maintenance counseling: 1. Anticipatory guidance: Patient counseled regarding regular dental exams yes-q6 months, eye exams yes - twice yearly,  avoiding smoking and second hand smoke- yes , limiting alcohol to 2 beverages per day .   2. Risk factor reduction:  Advised patient of need for regular exercise and diet rich and fruits and vegetables to reduce risk of heart attack and stroke. Exercise- runs 5 days a week. Diet-reasonably healthy diet  Wt Readings from Last 3 Encounters:  06/21/19 171 lb 3.2 oz (77.7 kg)  06/19/18 168 lb 12.8 oz (76.6 kg)  06/16/17 170 lb 3.2 oz (77.2 kg)  3. Immunizations/screenings/ancillary studies-flu shot already received  Immunization History  Administered Date(s) Administered  . Influenza Split 04/10/2012, 03/28/2013  . Influenza,inj,Quad PF,6+ Mos 05/09/2015, 03/18/2016, 05/18/2018, 05/18/2019   . Influenza-Unspecified 04/18/2013, 05/13/2014, 04/11/2017, 05/18/2018  . Td 07/28/2007  . Tdap 06/19/2018  . Zoster Recombinat (Shingrix) 06/19/2018, 09/28/2018  4. Prostate cancer screening- trend PSA alone given prior low risk trend.  Defer rectal. Lab Results  Component Value Date   PSA 0.52 06/19/2018   PSA 0.53 06/16/2017   PSA 0.52 03/02/2016   5. Colon cancer screening - history of Adenomatous polyp of colon but not on last exam and was stretched back to 10 year follow up .  Last colonoscopy 2018 - due in 2028 6. Skin cancer screening-follows with Dr. Delman Cheadle- 1 to 2 x a year depending on findings. advised regular sunscreen use. Denies worrisome, changing, or new skin lesions.  7.  Never smoker  Status of chronic or acute concerns   Elbow surgery scheduled for Monday with Dr. Griffin Basil- golfer's surgery  Hyperlipidemia, unspecified hyperlipidemia type-not currently on treatment.  We will update lipid panel and ASCVD risk calculation . Based on current BP and last years lipids- risk is at 6.5%- below 7.5% threshold    Migraine without status migrainosus-patient uses sumatriptan as needed (feels slightly off with medication-feels like it heightens his senses -reports using about once every 2 weeks- only mild increaes from 1-2x a month last year- will continue to monitor. Feels like allergies contribute.  -He is also used 1500 mg of Tylenol in the past-I have advised against- he still uses this- uses tylenol 2-3x a week more towards end of the week  - with patient using medication now 2-3x a week and many weeks with 3x - we considered medicine like metoprolol or verapamil but his HR is on the slow side. Before using option like topamax or amitriptyline - he would like to consult with neurolgoy - neurology consult provided today. Will follow along for notes  Leukopenia-very mild baseline leukopenia since he was in his 80s.  All other cell lines have been normal-continue to monitor    Allergic rhinitis-came off flonase as glaucoma suspect- symptoms did not far worsen .  Also alternates Allegra and Claritin.  He uses this with decongestant which we have discussed could affect blood pressure. Discussed could try neil med sinus rinse - decongestant may raise blood pressure- if blood pressure is high at home would be great to try to come off of these  Elevated blood pressure readings possible hypertension-has had high normal diastolic readings in the past  - decongestant may raise blood pressure- if blood pressure is high at home would be great to try to come off of these  - if cant come off decongestant and BP stays high- we may need to consider medicine to help blood pressure   I would like for  you to buy/use a home cuff to check at least 4x a week. Your goal is 138/88 or less on average. Please get 2-3 weeks of readings and then send me a message.   ED visit for poison ivy in May-cleared up with prednisone   Recommended follow up: Return in about 1 year (around 06/20/2020) for physical or sooner if needed.  Lab/Order associations: fasting   ICD-10-CM   1. Preventative health care  Z00.00 CBC with Differential/Platelet    Comprehensive metabolic panel    Lipid panel    PSA  2. Hyperlipidemia, unspecified hyperlipidemia type  E78.5 CBC with Differential/Platelet    Comprehensive metabolic panel    Lipid panel  3. Migraine without status migrainosus, not intractable, unspecified migraine type  G43.909   4. Adenomatous polyp of colon, unspecified part of colon  D12.6   5. Leukopenia, unspecified type  D72.819   6. Allergic rhinitis due to pollen, unspecified seasonality  J30.1   7. Screening for prostate cancer  Z12.5 PSA    No orders of the defined types were placed in this encounter.   Return precautions advised.  Garret Reddish, MD

## 2019-06-25 DIAGNOSIS — M7701 Medial epicondylitis, right elbow: Secondary | ICD-10-CM | POA: Diagnosis not present

## 2019-06-29 DIAGNOSIS — M7701 Medial epicondylitis, right elbow: Secondary | ICD-10-CM | POA: Diagnosis not present

## 2019-07-02 DIAGNOSIS — M7701 Medial epicondylitis, right elbow: Secondary | ICD-10-CM | POA: Diagnosis not present

## 2019-07-02 DIAGNOSIS — M25521 Pain in right elbow: Secondary | ICD-10-CM | POA: Diagnosis not present

## 2019-07-02 DIAGNOSIS — M6281 Muscle weakness (generalized): Secondary | ICD-10-CM | POA: Diagnosis not present

## 2019-07-02 DIAGNOSIS — M25621 Stiffness of right elbow, not elsewhere classified: Secondary | ICD-10-CM | POA: Diagnosis not present

## 2019-07-07 ENCOUNTER — Encounter: Payer: Self-pay | Admitting: Family Medicine

## 2019-07-10 DIAGNOSIS — M7701 Medial epicondylitis, right elbow: Secondary | ICD-10-CM | POA: Diagnosis not present

## 2019-07-10 DIAGNOSIS — M6281 Muscle weakness (generalized): Secondary | ICD-10-CM | POA: Diagnosis not present

## 2019-07-10 DIAGNOSIS — M25521 Pain in right elbow: Secondary | ICD-10-CM | POA: Diagnosis not present

## 2019-07-10 DIAGNOSIS — M25621 Stiffness of right elbow, not elsewhere classified: Secondary | ICD-10-CM | POA: Diagnosis not present

## 2019-07-16 DIAGNOSIS — M7701 Medial epicondylitis, right elbow: Secondary | ICD-10-CM | POA: Insufficient documentation

## 2019-07-17 DIAGNOSIS — M7701 Medial epicondylitis, right elbow: Secondary | ICD-10-CM | POA: Diagnosis not present

## 2019-07-17 DIAGNOSIS — M25521 Pain in right elbow: Secondary | ICD-10-CM | POA: Diagnosis not present

## 2019-07-17 DIAGNOSIS — M25621 Stiffness of right elbow, not elsewhere classified: Secondary | ICD-10-CM | POA: Diagnosis not present

## 2019-07-17 DIAGNOSIS — M6281 Muscle weakness (generalized): Secondary | ICD-10-CM | POA: Diagnosis not present

## 2019-07-26 ENCOUNTER — Encounter: Payer: Self-pay | Admitting: Family Medicine

## 2019-07-27 ENCOUNTER — Other Ambulatory Visit: Payer: Self-pay

## 2019-07-27 MED ORDER — AMLODIPINE BESYLATE 2.5 MG PO TABS: 2.5000 mg | ORAL_TABLET | Freq: Every day | ORAL | 5 refills | Status: DC

## 2019-08-01 ENCOUNTER — Encounter: Payer: Self-pay | Admitting: Neurology

## 2019-08-01 NOTE — Progress Notes (Signed)
Virtual Visit via Video Note The purpose of this virtual visit is to provide medical care while limiting exposure to the novel coronavirus.    Consent was obtained for video visit:  Yes.   Answered questions that patient had about telehealth interaction:  Yes.   I discussed the limitations, risks, security and privacy concerns of performing an evaluation and management service by telemedicine. I also discussed with the patient that there may be a patient responsible charge related to this service. The patient expressed understanding and agreed to proceed.  Pt location: Home Physician Location: office Name of referring provider:  Marin Olp, MD I connected with Pati Gallo at patients initiation/request on 08/03/2019 at  7:50 AM EST by video enabled telemedicine application and verified that I am speaking with the correct person using two identifiers. Pt MRN:  [id number] Pt DOB:  1960-09-04 Video Participants:  Pati Gallo   History of Present Illness:  Matthew Fox. Mcconnel is a 59 year old male with chronic leukopenia who presents for migraines.  History supplemented by referring provider note.  Onset:  Since his late-30s Location: varies (mid-frontal; less frequent right temple) Quality:  pounding Intensity:  5-6/10.  He denies new headache, thunderclap headache Aura:  none Premonitory Phase:  none Postdrome:  none Associated symptoms:  None.  He denies associated nausea, vomiting, photophobia, phonophobia, osmophobia, visual disturbance or unilateral numbness or weakness. Duration:  Usually 2 hours (rarely may last over a day) Frequency:  1 to 2 days a week.  Usually gets them later in week (Thursday and through weekend). Frequency of abortive medication: Tylenol 1 to 2 days a week. Triggers:  Seasonal allergies; glass of red wine Exacerbating factors:  Laying in bed if he has one Relieving factors:  Getting up out of bed Activity:  Does not aggravate Started  on amlodipine a week ago for high blood pressure.  Recently stopped Claritin D (was on it chronically for many years).  Headaches are unchanged for many years.  Since stopping Claritin D in December, intensity is less  Rescue therapy:  3 Tylenol; sumatriptan as second line Current NSAIDS:  none Current analgesics:  Tylenol Current triptans:  none Current ergotamine:  none Current anti-emetic:  none Current muscle relaxants:  none Current anti-anxiolytic:  none Current sleep aide:  none Current Antihypertensive medications:  Amlodipine 2.5mg  daily Current Antidepressant medications:  none Current Anticonvulsant medications:  none Current anti-CGRP:  none Current Vitamins/Herbal/Supplements:  ginsing Current Antihistamines/Decongestants:  none Other therapy:  none Hormone/birth control:  none  Past NSAIDS/steroids:  Mobic; prednisone; ibuprofen; ASA Past analgesics:  Excedrin (effective) Past abortive triptans:  Relpax 40mg ; sumatriptan 50mg  (not currently) Past abortive ergotamine:  none Past muscle relaxants:  none Past anti-emetic:  none Past antihypertensive medications:  none Past antidepressant medications:  none Past anticonvulsant medications:  none Past anti-CGRP:  none Past vitamins/Herbal/Supplements:  none Past antihistamines/decongestants:  Clariitin, Allegra Other past therapies:  none  Caffeine:  Drinks 8 cups of coffee most mornings; sometimes a [place] Alcohol:  Usually 1 beer a day (sometimes 2) Smoker:  no Diet:  Does not drink a lot of water.  Eats pretty healthy Exercise:  Runs 3 to 4 days a week.  Uses a Boflex.  Exercises 30-40 minutes a day, 6 days a week. Depression:  no; Anxiety:  Some work-related stress.  Wealth advisor. Other pain:  no Sleep hygiene:  Good. Family history of headache:  Maternal grandfather  06/21/2019 LABS:  CBC normal except  for baseline low WBC 3.7; CMP normal.   Past Medical History: Past Medical History:  Diagnosis  Date  . Adenomatous colon polyp    2013, 5 year repeat  . Allergy   . Hyperlipidemia    no rx. total just above 200, HDL great, LDL in 120s 130s  . Leukopenia    stable in the 3s  . Migraine    relpax about once a month    Medications: Outpatient Encounter Medications as of 08/03/2019  Medication Sig  . amLODipine (NORVASC) 2.5 MG tablet Take 1 tablet (2.5 mg total) by mouth daily.  . fexofenadine (ALLEGRA) 180 MG tablet Take 180 mg by mouth daily.  . Ginseng 500 MG TABS Take 1 tablet by mouth daily.  Marland Kitchen loratadine-pseudoephedrine (CLARITIN-D 12-HOUR) 5-120 MG per tablet Take 1 tablet by mouth daily.  . SUMAtriptan (IMITREX) 50 MG tablet TAKE 1 TABLET BY MOUTH EVERY 2 HOURS AS NEEDED FOR MIGRAINE. MAY REPEAT IN 2 HOURS ONCE IF HEADACHE PERSISTS OR RECURS   No facility-administered encounter medications on file as of 08/03/2019.    Allergies: No Known Allergies  Family History: Family History  Problem Relation Age of Onset  . Colon cancer Maternal Grandfather        early 65s  . Lung cancer Mother        smoker, died 86 age 25  . Heart failure Father        rheumatic fever-died 23 months after mother, did not take care of himself  . Stomach cancer Neg Hx     Social History: Social History   Socioeconomic History  . Marital status: Married    Spouse name: Not on file  . Number of children: Not on file  . Years of education: Not on file  . Highest education level: Not on file  Occupational History  . Not on file  Tobacco Use  . Smoking status: Never Smoker  . Smokeless tobacco: Never Used  Substance and Sexual Activity  . Alcohol use: Yes    Alcohol/week: 7.0 standard drinks    Types: 7 Standard drinks or equivalent per week  . Drug use: No  . Sexual activity: Not on file  Other Topics Concern  . Not on file  Social History Narrative   Married (wife patient of Dr. 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    Prior Worked at [employer]: running, biking, golf   Social Determinants of Health   Financial Resource Strain:   . Difficulty of Paying Living Expenses: Not on file  Food Insecurity:   . Worried About Charity fundraiser in the Last Year: Not on file  . Ran Out of Food in the Last Year: Not on file  Transportation Needs:   . Lack of Transportation (Medical): Not on file  . Lack of Transportation (Non-Medical): Not on file  Physical Activity:   . Days of Exercise per Week: Not on file  . Minutes of Exercise per Session: Not on file  Stress:   . Feeling of Stress : Not on file  Social Connections:   . Frequency of Communication with Friends and Family: Not on file  . Frequency of  Social Gatherings with Friends and Family: Not on file  . Attends Religious Services: Not on file  . Active Member of Clubs or Organizations: Not on file  . Attends Archivist Meetings: Not on file  . Marital Status: Not on file  Intimate Partner Violence:   . Fear of Current or Ex-Partner: Not on file  . Emotionally Abused: Not on file  . Physically Abused: Not on file  . Sexually Abused: Not on file    Observations/Objective:   Height 5\' 10"  (1.778 m), weight 165 lb (74.8 kg). No acute distress.  Alert and oriented.  Speech fluent and not dysarthric.  Language intact.  Eyes orthophoric on primary gaze.  Face symmetric.  Assessment and Plan:   Episodic tension-type headaches, not intractable vs migraine.  There are no red-flags.  These are his habitual headaches for 15 to 20 years, unchanged.  Semiology is consistent with tension-type headaches but may overlap with migraine.  Therefore, I don't think imaging of the brain is warranted at this time.  They may have been aggravated by  chronic use of coffee and Claritin.  We discussed lifestyle modification and preventative medications (antidepressants, antiepileptic medications).  At this time, he would like to focus on lifestyle modification. 1.  He will try to limit caffeine intake 2.  May continue OTC analgesics (Tylenol).  Limit use of analgesics to no more than 2 days out of week. 3.  Continue routine exercise 4.  Increase water intake 5.  Monitor for any signs of sleep apnea 6.  Consider riboflavin 400mg  daily and CoQ10 300mg  daily 7.  If there is any worsening of headache or new symptoms, he will contact me.  If he changes his mind and wishes to start a preventative medication, he will contact me. 8.  We will follow up in 4 months.  Follow Up Instructions:    -I discussed the assessment and treatment plan with the patient. The patient was provided an opportunity to ask questions and all were answered. The patient agreed with the plan and demonstrated an understanding of the instructions.   The patient was advised to call back or seek an in-person evaluation if the symptoms worsen or if the condition fails to improve as anticipated.    Dudley Major, DO

## 2019-08-03 ENCOUNTER — Telehealth (INDEPENDENT_AMBULATORY_CARE_PROVIDER_SITE_OTHER): Payer: BC Managed Care – PPO | Admitting: Neurology

## 2019-08-03 ENCOUNTER — Other Ambulatory Visit: Payer: Self-pay

## 2019-08-03 VITALS — Ht 70.0 in | Wt 165.0 lb

## 2019-08-03 DIAGNOSIS — G44219 Episodic tension-type headache, not intractable: Secondary | ICD-10-CM

## 2019-08-09 DIAGNOSIS — H40053 Ocular hypertension, bilateral: Secondary | ICD-10-CM | POA: Diagnosis not present

## 2019-08-21 DIAGNOSIS — M25521 Pain in right elbow: Secondary | ICD-10-CM | POA: Diagnosis not present

## 2019-08-27 NOTE — Progress Notes (Deleted)
Phone: [phone]   Subjective:  Patient presents today for their annual physical. Chief complaint-noted.   See problem oriented charting- ROS- full  review of systems was completed and negative  except for: ***  The following were reviewed and entered/updated in epic: Past Medical History:  Diagnosis Date  . Adenomatous colon polyp    2013, 5 year repeat  . Allergy   . HTN (hypertension)   . Hyperlipidemia    no rx. total just above 200, HDL great, LDL in 120s 130s  . Leukopenia    stable in the 3s  . Migraine    relpax about once a month   Patient Active Problem List   Diagnosis Date Noted  . Medial epicondylitis of elbow, right 07/16/2019  . Allergic rhinitis 06/16/2017  . Adenomatous colon polyp   . Migraine   . Hyperlipidemia   . Leukopenia    Past Surgical History:  Procedure Laterality Date  . ANAL FISSURE REPAIR  2007  . COLONOSCOPY W/ POLYPECTOMY    . HERNIA REPAIR     as infant; bilateral  . right elbow surgery    . VASECTOMY      Family History  Problem Relation Age of Onset  . Colon cancer Maternal Grandfather        early 66s  . Lung cancer Mother        smoker, died 34 age 33  . Heart failure Father        rheumatic fever-died 23 months after mother, did not take care of himself  . Stomach cancer Neg Hx     Medications- reviewed and updated Current Outpatient Medications  Medication Sig Dispense Refill  . amLODipine (NORVASC) 2.5 MG tablet Take 1 tablet (2.5 mg total) by mouth daily. 30 tablet 5  . fexofenadine (ALLEGRA) 180 MG tablet Take 180 mg by mouth daily.    . Ginseng 500 MG TABS Take 1 tablet by mouth daily.    Marland Kitchen loratadine-pseudoephedrine (CLARITIN-D 12-HOUR) 5-120 MG per tablet Take 1 tablet by mouth daily.    . meloxicam (MOBIC) 7.5 MG tablet Take 7.5 mg by mouth daily.    . SUMAtriptan (IMITREX) 50 MG tablet TAKE 1 TABLET BY MOUTH EVERY 2 HOURS AS NEEDED FOR MIGRAINE. MAY REPEAT IN 2 HOURS ONCE IF HEADACHE PERSISTS OR RECURS  (Patient not taking: Reported on 08/01/2019) 10 tablet 0   No current facility-administered medications for this visit.    Allergies-reviewed and updated No Known Allergies  Social History   Social History Narrative   Married (wife patient of Dr. 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 Gen: NAD, resting comfortably HEENT: Mucous membranes are moist. Oropharynx normal Neck: no thyromegaly CV: RRR no murmurs rubs or gallops Lungs: CTAB no crackles, wheeze, rhonchi Abdomen: soft/nontender/nondistended/normal bowel sounds. No rebound or guarding.  Ext: no edema Skin: warm, dry Neuro: grossly normal, moves all extremities, PERRLA ***   Assessment and Plan  59 y.o. male presenting for annual physical.  Health Maintenance counseling: 1. Anticipatory guidance: Patient counseled  regarding regular dental exams ***q6 months, eye exams ***yearly,  avoiding smoking and second hand smoke*** , limiting alcohol to 2 beverages per day ***.   2. Risk factor reduction:  Advised patient of need for regular exercise and diet rich and fruits and vegetables to reduce risk of heart attack and stroke. Exercise- ***. Diet-***.  Wt Readings from Last 3 Encounters:  08/01/19 165 lb (74.8 kg)  06/21/19 171 lb 3.2 oz (77.7 kg)  06/19/18 168 lb 12.8 oz (76.6 kg)   3. Immunizations/screenings/ancillary studies Immunization History  Administered Date(s) Administered  . Influenza Split 04/10/2012, 03/28/2013  . Influenza,inj,Quad PF,6+ Mos 05/09/2015, 03/18/2016, 05/18/2018, 05/18/2019  .  Influenza-Unspecified 04/18/2013, 05/13/2014, 04/11/2017, 05/18/2018  . Td 07/28/2007  . Tdap 06/19/2018  . Zoster Recombinat (Shingrix) 06/19/2018, 09/28/2018   There are no preventive care reminders to display for this patient. 4. Prostate cancer screening- ***  Lab Results  Component Value Date   PSA 0.53 06/21/2019   PSA 0.52 06/19/2018   PSA 0.53 06/16/2017   5. Colon cancer screening - *** 6. Skin cancer screening- ***advised regular sunscreen use. Denies worrisome, changing, or new skin lesions.  7. *** smoker  Status of chronic or acute concerns   No specialty comments available. *** No diagnosis found.  Recommended follow up: ***No follow-ups on file. Future Appointments  Date Time Provider [place]  08/28/2019 11:20 AM Marin Olp, MD [facility]  12/05/2019  8:50 AM Pieter Partridge, DO [facility] None  06/25/2020  8:00 AM Yong Channel Brayton Mars, MD [facility]    No chief complaint on file.  Lab/Order associations:*** fasting No diagnosis found.  No orders of the defined types were placed in this encounter.   Return precautions advised.  Francella Solian, CMA

## 2019-08-28 ENCOUNTER — Other Ambulatory Visit: Payer: Self-pay

## 2019-08-28 ENCOUNTER — Encounter: Payer: Self-pay | Admitting: Family Medicine

## 2019-08-28 ENCOUNTER — Ambulatory Visit (INDEPENDENT_AMBULATORY_CARE_PROVIDER_SITE_OTHER): Payer: BC Managed Care – PPO | Admitting: Family Medicine

## 2019-08-28 DIAGNOSIS — I10 Essential (primary) hypertension: Secondary | ICD-10-CM

## 2019-08-28 DIAGNOSIS — J301 Allergic rhinitis due to pollen: Secondary | ICD-10-CM | POA: Diagnosis not present

## 2019-08-28 DIAGNOSIS — G43909 Migraine, unspecified, not intractable, without status migrainosus: Secondary | ICD-10-CM | POA: Diagnosis not present

## 2019-08-28 MED ORDER — AMLODIPINE BESYLATE 2.5 MG PO TABS: 2.5000 mg | ORAL_TABLET | Freq: Every day | ORAL | 3 refills | Status: DC

## 2019-08-28 NOTE — Assessment & Plan Note (Addendum)
Patient updates me on his visit with neurology.  He states they added riboflavin and coenzyme Q 10 and increase his water intake.  He is up to about 80 ounces a day.  His migraine pattern has improved significantly down to about 3 headaches in January and none in the last 2 weeks.  Also possible amlodipine is helping reduce migraine frequency-continue current medications

## 2019-08-28 NOTE — Patient Instructions (Addendum)
Thanks for all your hard work and all the data points for me today on blood pressure.   Home average over last  15 readings is about 130/85. I think that's very reasonable- lets continue current dose. Continue to check 2-3x a week- if you see #s trend up let me know and we can always adjust to 5 mg if needed.   Thrilled with improvement in migraines as well!   Recommended follow up: Return in about 6 months (around 02/25/2020) for follow up- or sooner if needed.

## 2019-08-28 NOTE — Assessment & Plan Note (Signed)
S:Patient is currently on Amlodipine 2.5mg . He has brought a list of his home readings as well as his monitor in office today.   Prior we thought high blood pressure could be related to decongestant use but he came office and blood pressure remained high so we started amlodipine through MyChart interactions  Patient has done an excellent job with home monitoring.  Is using a wrist cuff.  Home blood pressure average in the last 15 readings 130/85. BP Readings from Last 3 Encounters:  08/28/19 130/76  06/21/19 (!) 142/92  06/19/18 126/88  A/P: Much improved control now on amlodipine 2.5 mg.  Possible this is also helping his migraine headache pattern -He can reduce his blood pressure checks to 2 or 3 times a week as long as pressure remains controlled -Discussed that he would like to push to get less than 130/80 on average at home he could try amlodipine 5 mg-in the end we opted to monitor without medication change but he will reach out if home blood pressure increases  His home cuff is very close to my reading-I got 146/86 and home cuff was 148/86.  When Joellen checked his cuff was slightly higher but in both cases this would suggest home cuff is either accurate or slightly high and since home readings are reasonable at 130/85 do not need to adjust meds -Discussed generally I prefer upper arm cuffs over wrist cuffs  May still have slight whitecoat hypertension element-repeat blood pressure when I was in the room was elevated on his cuff and my reading

## 2019-08-28 NOTE — Assessment & Plan Note (Signed)
Patient is off Flonase since he is a melanoma suspect.  He uses over-the-counter antihistamines.  We have discussed that decongestants can raise blood pressure so he is trying to avoid.  He has had pretty good success with NeilMed sinus rinse twice a day.  He is hoping this keeps up during allergy season.  If symptoms worsen during allergy season he would like to consider allergist referral-told him I am happy to place this if he needs it-he will reach out if need be.  For now continue current medications

## 2019-08-28 NOTE — Progress Notes (Signed)
Phone [phone] In person visit   Subjective:   Matthew Fox is a 59 y.o. year old very pleasant male patient who presents for/with See problem oriented charting Chief Complaint  Patient presents with  . Hypertension   This visit occurred during the SARS-CoV-2 public health emergency.  Safety protocols were in place, including screening questions prior to the visit, additional usage of staff PPE, and extensive cleaning of exam room while observing appropriate contact time as indicated for disinfecting solutions.   Past Medical History-  Patient Active Problem List   Diagnosis Date Noted  . Allergic rhinitis 06/16/2017    Priority: Medium  . Migraine     Priority: Medium  . Hyperlipidemia     Priority: Medium  . Adenomatous colon polyp     Priority: Low  . Leukopenia     Priority: Low  . Essential hypertension 08/28/2019  . Medial epicondylitis of elbow, right 07/16/2019    Medications- reviewed and updated Current Outpatient Medications  Medication Sig Dispense Refill  . amLODipine (NORVASC) 2.5 MG tablet Take 1 tablet (2.5 mg total) by mouth daily. 90 tablet 3  . fexofenadine (ALLEGRA) 180 MG tablet Take 180 mg by mouth daily.    . meloxicam (MOBIC) 7.5 MG tablet Take 7.5 mg by mouth daily.    . SUMAtriptan (IMITREX) 50 MG tablet TAKE 1 TABLET BY MOUTH EVERY 2 HOURS AS NEEDED FOR MIGRAINE. MAY REPEAT IN 2 HOURS ONCE IF HEADACHE PERSISTS OR RECURS 10 tablet 0   No current facility-administered medications for this visit.     Objective:  BP 130/76   Pulse 60   Temp 97.6 F (36.4 C) (Temporal)   Ht 5\' 10"  (1.778 m)   Wt 172 lb 6.4 oz (78.2 kg)   SpO2 96%   BMI 24.74 kg/m  Gen: NAD, resting comfortably CV: RRR no murmurs rubs or gallops Lungs: CTAB no crackles, wheeze, rhonchi Ext: no edema Skin: warm, dry    Assessment and Plan  # Hypertension  S:Patient is currently on Amlodipine 2.5mg . He has brought a list of his home readings as well as his  monitor in office today.   Prior we thought high blood pressure could be related to decongestant use but he came office and blood pressure remained high so we started amlodipine through MyChart interactions  Patient has done an excellent job with home monitoring.  Is using a wrist cuff.  Home blood pressure average in the last 15 readings 130/85. BP Readings from Last 3 Encounters:  08/28/19 130/76  06/21/19 (!) 142/92  06/19/18 126/88  A/P: Much improved control now on amlodipine 2.5 mg.  Possible this is also helping his migraine headache pattern -He can reduce his blood pressure checks to 2 or 3 times a week as long as pressure remains controlled -Discussed that he would like to push to get less than 130/80 on average at home he could try amlodipine 5 mg-in the end we opted to monitor without medication change but he will reach out if home blood pressure increases  His home cuff is very close to my reading-I got 146/86 and home cuff was 148/86.  When Joellen checked his cuff was slightly higher but in both cases this would suggest home cuff is either accurate or slightly high and since home readings are reasonable at 130/85 do not need to adjust meds -Discussed generally I prefer upper arm cuffs over wrist cuffs  Migraine Patient updates me on his visit with neurology.  He  states they added riboflavin and coenzyme Q 10 and increase his water intake.  He is up to about 80 ounces a day.  His migraine pattern has improved significantly down to about 3 headaches in January and none in the last 2 weeks.  Also possible amlodipine is helping reduce migraine frequency-continue current medications  Allergic rhinitis Patient is off Flonase since he is a melanoma suspect.  He uses over-the-counter antihistamines.  We have discussed that decongestants can raise blood pressure so he is trying to avoid.  He has had pretty good success with NeilMed sinus rinse twice a day.  He is hoping this keeps up  during allergy season.  If symptoms worsen during allergy season he would like to consider allergist referral-told him I am happy to place this if he needs it-he will reach out if need be.  For now continue current medications  Recommended follow up: Return in about 6 months (around 02/25/2020) for follow up- or sooner if needed.   I did give him enough refills to make it to his physical-as long as home readings remain controlled he may wait until that visit Future Appointments  Date Time Provider [place]  12/05/2019  8:50 AM Pieter Partridge, DO [facility] None  06/25/2020  8:00 AM Yong Channel Brayton Mars, MD [facility]    Lab/Order associations:   ICD-10-CM   1. Migraine without status migrainosus, not intractable, unspecified migraine type  G43.909   2. Allergic rhinitis due to pollen, unspecified seasonality  J30.1   3. Essential hypertension  I10    Refilled medication for 90 days with 3 refills Meds ordered this encounter  Medications  . amLODipine (NORVASC) 2.5 MG tablet    Sig: Take 1 tablet (2.5 mg total) by mouth daily.    Dispense:  90 tablet    Refill:  3   Return precautions advised.  Garret Reddish, MD

## 2019-10-12 DIAGNOSIS — L7211 Pilar cyst: Secondary | ICD-10-CM | POA: Diagnosis not present

## 2019-12-03 IMAGING — MR MR ELBOW*R* W/O CM
5 series · 40 of 40 positions shown · non-contrast
Comparison: None.

CLINICAL DATA: Medial elbow pain for 8 months.  No specific injury.

EXAM:
MRI OF THE RIGHT ELBOW WITHOUT CONTRAST
TECHNIQUE: Multiplanar, multisequence MR imaging of the elbow was performed. No
intravenous contrast was administered.

[Series 100: T1 · axial · right · 3.0mm · 0.38mm/px · z∈[+9,+109]mm · 9 of 27 slices shown (1 of 2)]
[im 1/27]
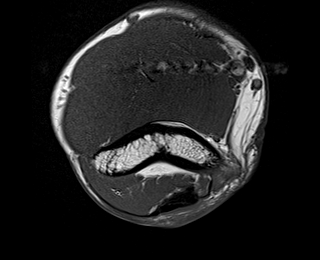
[im 4/27]
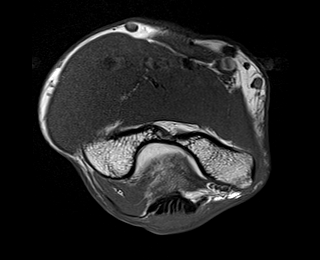
[im 7/27]
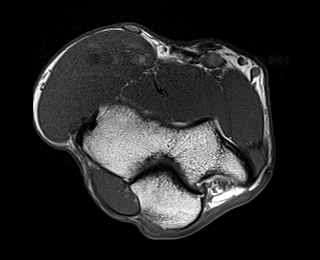
[im 10/27]
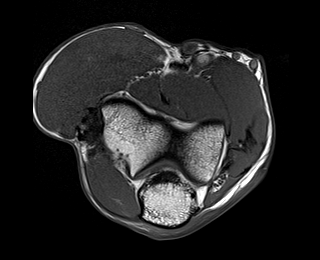
[im 14/27]
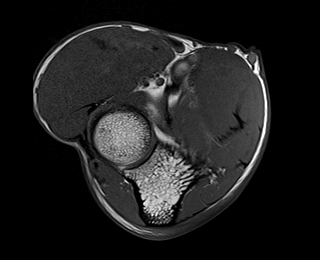
[im 17/27]
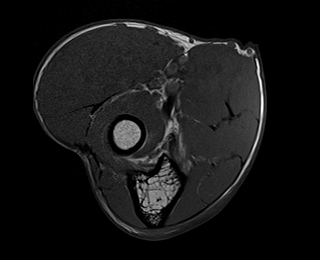
[im 20/27]
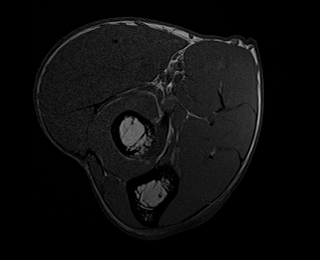
[im 23/27]
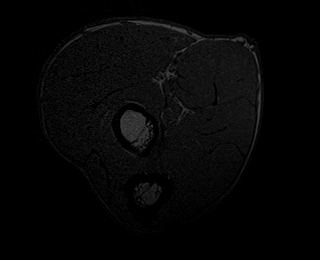
[im 27/27]
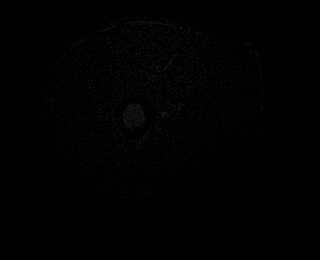

[Series 101: T2 fat-sat · axial · right · 3.0mm · 0.38mm/px · z∈[+9,+109]mm · 9 of 27 slices shown (1 of 2)]
[im 1/27]
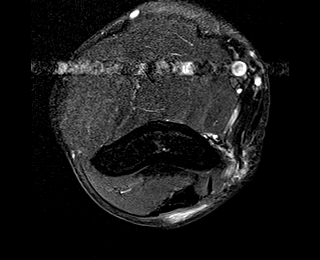
[im 4/27]
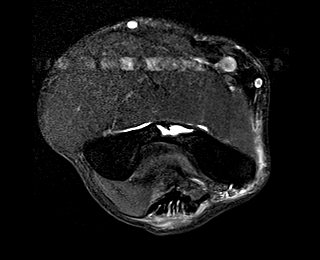
[im 7/27]
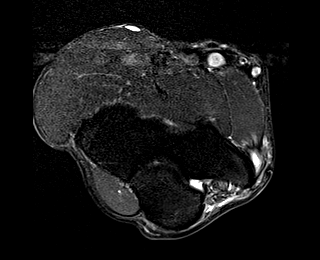
[im 10/27]
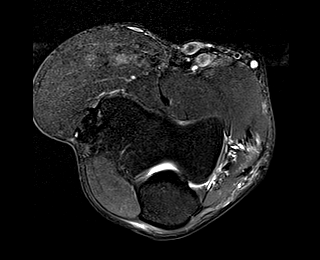
[im 14/27]
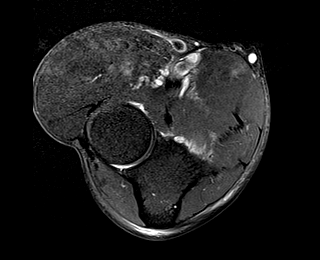
[im 17/27]
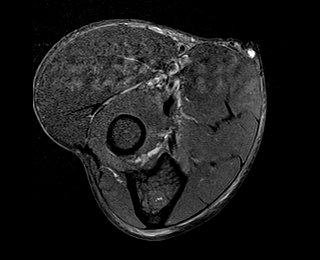
[im 20/27]
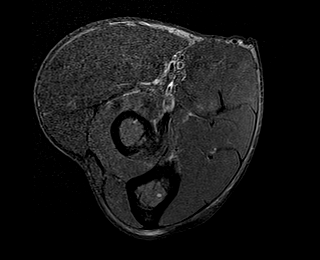
[im 23/27]
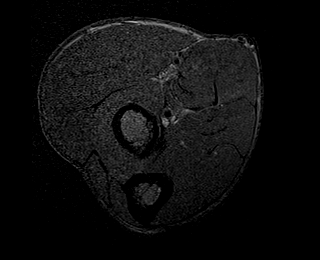
[im 27/27]
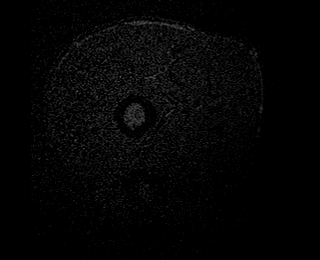

[Series 102: T2 fat-sat · oblique · right · 3.0mm · 0.44mm/px · 7 of 21 slices shown (2 of 2)]
[im 1/21]
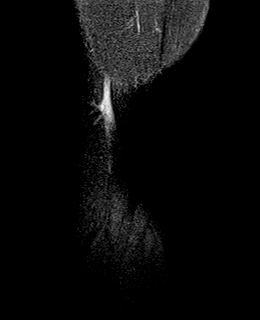
[im 4/21]
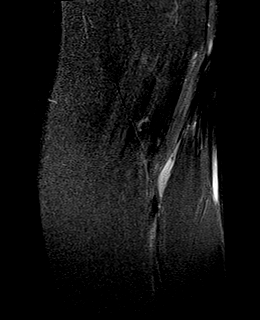
[im 7/21]
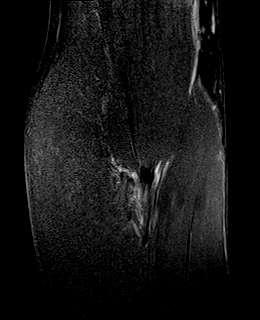
[im 11/21]
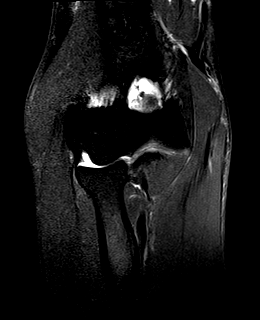
[im 14/21]
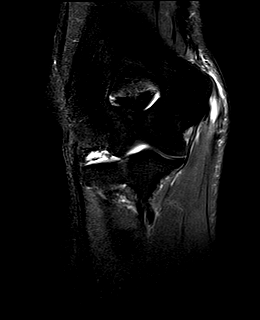
[im 17/21]
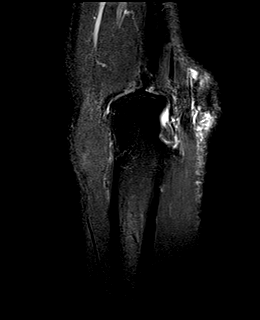
[im 21/21]
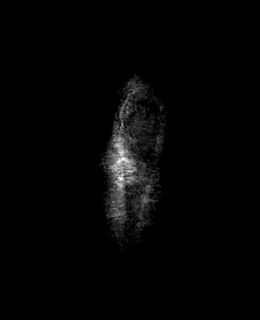

[Series 103: T1 · oblique · right · 3.0mm · 0.44mm/px · 7 of 21 slices shown (2 of 2)]
[im 1/21]
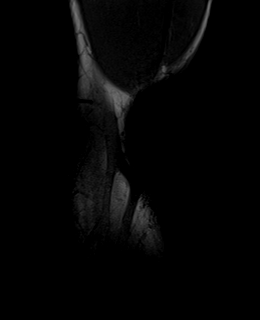
[im 4/21]
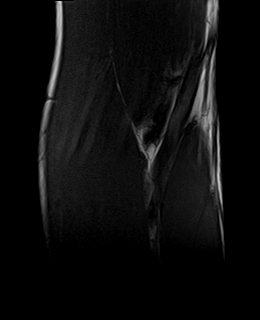
[im 7/21]
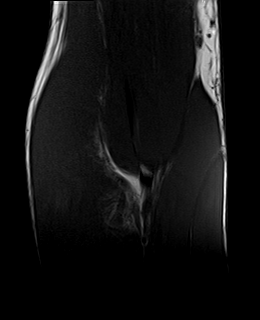
[im 11/21]
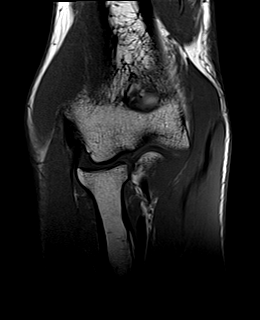
[im 14/21]
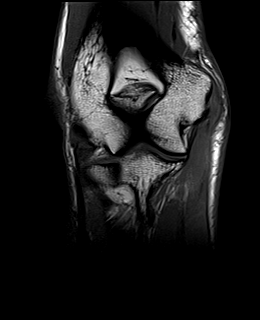
[im 17/21]
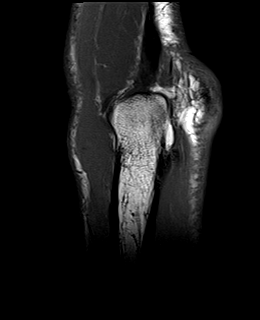
[im 21/21]
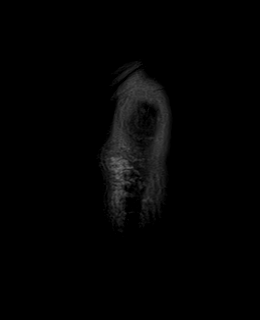

[Series 104: PD fat-sat · oblique · right · 3.0mm · 0.44mm/px · 8 of 23 slices shown]
[im 1/23]
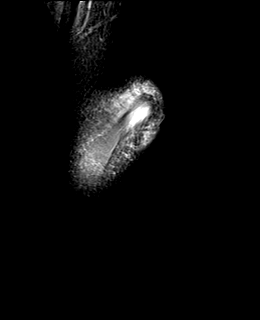
[im 4/23]
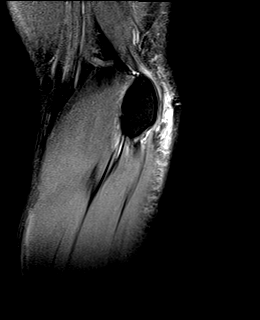
[im 7/23]
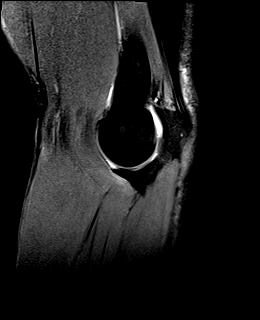
[im 10/23]
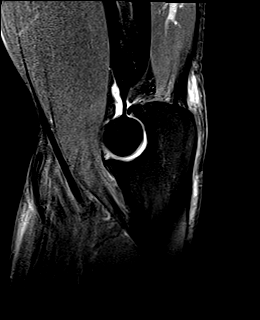
[im 13/23]
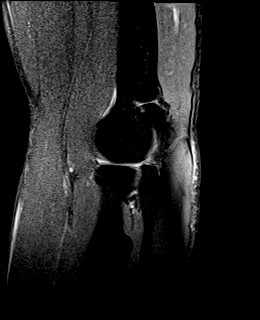
[im 16/23]
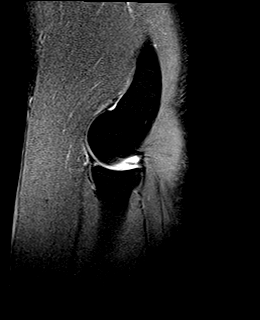
[im 19/23]
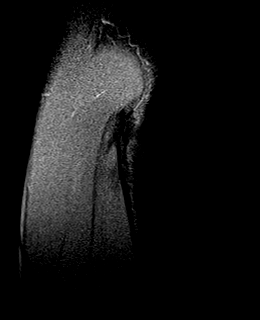
[im 23/23]
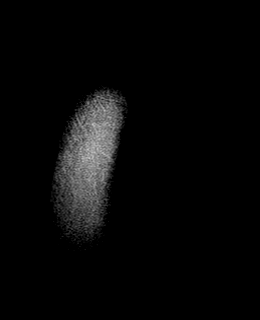

[40 of 40 positions shown; findings below may reference images not displayed]

FINDINGS: TENDONS

Common forearm flexor origin: Significant tendinopathy with
interstitial tears and partial-thickness tearing of the deep
attachment fibers. Some of the torn fibers are retracted up to 10
mm. There is also some inflammation/edema tracking back into the
musculotendinous junction and is also inflammation in the
surrounding subcutaneous tissues around the medial epicondyle. No
definite marrow edema/medial epicondylitis.

Common forearm extensor origin: Intact.  No tendinopathy or tear.

Biceps: Intact

Triceps: Intact

LIGAMENTS

Medial stabilizers: Intact

Lateral stabilizers:  Intact

Cartilage: No cartilage defects or osteochondral lesion.

Joint: No joint effusion or loose bodies.

Cubital tunnel: No mass or mass effect involving the ulnar nerve.

Bones: No acute bony findings.  No bone lesions.
IMPRESSION: 1. Significant tendinopathy involving the common flexor tendon with
interstitial tears and partial-thickness tearing of the deep
attachment fibers.
2. Intact medial and lateral collateral ligaments.
3. The biceps, brachialis and triceps tendons are intact.
4. No significant bony findings.

## 2019-12-04 NOTE — Progress Notes (Signed)
NEUROLOGY FOLLOW UP OFFICE NOTE  Matthew Fox [id number]  HISTORY OF PRESENT ILLNESS: Matthew Fox is a 59 year old male with chronic leukopenia who follows up for headaches.  UPDATE He has modified lifestyle.  Started blood pressure medication.  He has increased fluid intake (60-80 oz diluted Crystal Lite daily).  Stopped Claritin. Uses nasal rinse.  Reduced Tylenol intake.  May take Tylenol once a week at the most.  He has had improvement in headaches.  Twice a month.  Hasn't used sumatriptan since January.  Rescue therapy:  3 Tylenol Current NSAIDS:  none Current analgesics:  Tylenol Current triptans:  none Current ergotamine:  none Current anti-emetic:  none Current muscle relaxants:  none Current anti-anxiolytic:  none Current sleep aide:  none Current Antihypertensive medications:  Amlodipine 2.5mg  daily Current Antidepressant medications:  none Current Anticonvulsant medications:  none Current anti-CGRP:  none Current Vitamins/Herbal/Supplements:  CoQ10 300mg ; magnesium 400mg  Current Antihistamines/Decongestants:  none Other therapy:  none Hormone/birth control:  none  Caffeine:  3/4 pot of coffee most mornings; 1 to 2 Sundrops a week Alcohol:  Usually 1 beer a day (sometimes 2) Smoker:  no Exercise:  Runs 3 to 4 days a week.  Uses a Boflex.  Exercises 30-40 minutes a day, 6 days a week. Depression:  no; Anxiety:  Some work-related stress.  Wealth advisor. Other pain:  no Sleep hygiene:  Good.  HISTORY: Onset:  Since his late-30s Location: varies (mid-frontal; less frequent right temple) Quality:  pounding Initial intensity:  5-6/10.  He denies new headache, thunderclap headache Aura:  none Premonitory Phase:  none Postdrome:  none Associated symptoms:  None.  He denies associated nausea, vomiting, photophobia, phonophobia, osmophobia, visual disturbance or unilateral numbness or weakness. Initial duration:  Usually 2 hours (rarely may last over a  day) Initial frequency:  1 to 2 days a week.  Usually gets them later in week (Thursday and through weekend). Initial frequency of abortive medication: Tylenol 1 to 2 days a week. Triggers:  Seasonal allergies; glass of red wine Exacerbating factors:  Laying in bed if he has one Relieving factors:  Getting up out of bed Activity:  Does not aggravate Started on amlodipine a week ago for high blood pressure.  In late 2020, he stopped Claritin D (was on it chronically for many years).  Headaches are unchanged for many years.  Since stopping Claritin D in December, intensity is less  Past NSAIDS/steroids:  Mobic; prednisone; ibuprofen; ASA Past analgesics:  Excedrin (effective) Past abortive triptans:  Relpax 40mg ; sumatriptan 50mg  (not currently) Past abortive ergotamine:  none Past muscle relaxants:  none Past anti-emetic:  none Past antihypertensive medications:  none Past antidepressant medications:  none Past anticonvulsant medications:  none Past anti-CGRP:  none Past vitamins/Herbal/Supplements:  none Past antihistamines/decongestants:  Clariitin, Allegra Other past therapies:  none   Family history of headache:  Maternal grandfather  PAST MEDICAL HISTORY: Past Medical History:  Diagnosis Date  . Adenomatous colon polyp    2013, 5 year repeat  . Allergy   . HTN (hypertension)   . Hyperlipidemia    no rx. total just above 200, HDL great, LDL in 120s 130s  . Leukopenia    stable in the 3s  . Migraine    relpax about once a month    MEDICATIONS: Current Outpatient Medications on File Prior to Visit  Medication Sig Dispense Refill  . amLODipine (NORVASC) 2.5 MG tablet Take 1 tablet (2.5 mg total) by mouth daily.  90 tablet 3  . fexofenadine (ALLEGRA) 180 MG tablet Take 180 mg by mouth daily.    . meloxicam (MOBIC) 7.5 MG tablet Take 7.5 mg by mouth daily.    . SUMAtriptan (IMITREX) 50 MG tablet TAKE 1 TABLET BY MOUTH EVERY 2 HOURS AS NEEDED FOR MIGRAINE. MAY REPEAT  IN 2 HOURS ONCE IF HEADACHE PERSISTS OR RECURS 10 tablet 0   No current facility-administered medications on file prior to visit.    ALLERGIES: No Known Allergies  FAMILY HISTORY: Family History  Problem Relation Age of Onset  . Colon cancer Maternal Grandfather        early 53s  . Lung cancer Mother        smoker, died 38 age 62  . Heart failure Father        rheumatic fever-died 23 months after mother, did not take care of himself  . Stomach cancer Neg Hx     SOCIAL HISTORY: Social History   Socioeconomic History  . Marital status: Married    Spouse name: Not on file  . Number of children: Not on file  . Years of education: Not on file  . Highest education level: Not on file  Occupational History  . Not on file  Tobacco Use  . Smoking status: Never Smoker  . Smokeless tobacco: Never Used  Substance and Sexual Activity  . Alcohol use: Yes    Alcohol/week: 7.0 standard drinks    Types: 7 Standard drinks or equivalent per week  . Drug use: No  . Sexual activity: Not on file  Other Topics Concern  . Not on file  Social History Narrative   Married (wife patient of Dr. 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    Prior Worked at [employer] handed,three story house   Hobbies: running, biking, golf   Social Determinants of Health   Financial Resource Strain:   . Difficulty of Paying Living Expenses:   Food Insecurity:   . Worried About Charity fundraiser in the Last Year:   . Arboriculturist in the Last Year:   Transportation Needs:   . Film/video editor (Medical):   Matthew Fox Lack of Transportation (Non-Medical):   Physical Activity:   . Days of Exercise per Week:   . Minutes of Exercise per Session:   Stress:    . Feeling of Stress :   Social Connections:   . Frequency of Communication with Friends and Family:   . Frequency of Social Gatherings with Friends and Family:   . Attends Religious Services:   . Active Member of Clubs or Organizations:   . Attends Archivist Meetings:   Matthew Fox Marital Status:   Intimate Partner Violence:   . Fear of Current or Ex-Partner:   . Emotionally Abused:   Matthew Fox Physically Abused:   . Sexually Abused:     PHYSICAL EXAM: Blood pressure 138/70, pulse 95, height 5\' 10"  (1.778 m), weight 173 lb 12.8 oz (78.8 kg), SpO2 97 %. General: No acute distress.  Patient appears well-groomed.   Head:  Normocephalic/atraumatic Eyes:  Fundi examined but not visualized Neck: supple, no paraspinal tenderness, full range of motion Heart:  Regular rate  and rhythm Lungs:  Clear to auscultation bilaterally Back: No paraspinal tenderness Neurological Exam: alert and oriented to person, place, and time. Attention span and concentration intact, recent and remote memory intact, fund of knowledge intact.  Speech fluent and not dysarthric, language intact.  CN II-XII intact. Bulk and tone normal, muscle strength 5/5 throughout.  Sensation to pinprick and vibration intact.  Deep tendon reflexes 2+ throughout, toes downgoing.  Finger to nose and heel to shin testing intact.  Gait normal, Romberg negative.  IMPRESSION: Episodic tension-type headache vs migraine, improved.  PLAN: 1.  Continue exercise, hydration, supplements (Mg, B2) 2.  Limit use of pain relievers to no more than 2 days out of week to prevent risk of rebound or medication-overuse headache. 3.  Keep headache diary 4.  Follow up as needed.  Metta Clines, DO  CC: Garret Reddish, MD

## 2019-12-05 ENCOUNTER — Ambulatory Visit (INDEPENDENT_AMBULATORY_CARE_PROVIDER_SITE_OTHER): Payer: BC Managed Care – PPO | Admitting: Neurology

## 2019-12-05 ENCOUNTER — Other Ambulatory Visit: Payer: Self-pay

## 2019-12-05 ENCOUNTER — Encounter: Payer: Self-pay | Admitting: Neurology

## 2019-12-05 VITALS — BP 138/70 | HR 95 | Ht 70.0 in | Wt 173.8 lb

## 2019-12-05 DIAGNOSIS — G44219 Episodic tension-type headache, not intractable: Secondary | ICD-10-CM | POA: Diagnosis not present

## 2019-12-19 ENCOUNTER — Telehealth: Payer: Self-pay | Admitting: Family Medicine

## 2019-12-19 ENCOUNTER — Telehealth: Payer: Self-pay | Admitting: Neurology

## 2019-12-19 DIAGNOSIS — H8112 Benign paroxysmal vertigo, left ear: Secondary | ICD-10-CM | POA: Diagnosis not present

## 2019-12-19 NOTE — Telephone Encounter (Signed)
Please call and schedule pt for appointment

## 2019-12-19 NOTE — Telephone Encounter (Signed)
Pt advised to contact his PCP.

## 2019-12-19 NOTE — Telephone Encounter (Signed)
Patient left message that yesterday he started experiencing dizziness and then started vomiting. Reports that today he still has some dizziness and is nauseated. Wants to know if he needs to be seen or what he should do. Please call.

## 2019-12-19 NOTE — Telephone Encounter (Signed)
Nurse Assessment Nurse: Hardin Negus, RN, Danae Chen Date/Time Eilene Ghazi Time): 12/19/2019 10:03:47 AM Confirm and document reason for call. If symptomatic, describe symptoms. ---Caller states he woke up yesterday with vertigo and nausea. Today nausea has subsided, but is still dizzy. Vomiting x1 after room spinning episode yesterday. Movement worsens the dizziness. Has the patient had close contact with a person known or suspected to have the novel coronavirus illness OR traveled / lives in area with major community spread (including international travel) in the last 14 days from the onset of symptoms? * If Asymptomatic, screen for exposure and travel within the last 14 days. ---No Does the patient have any new or worsening symptoms? ---Yes Will a triage be completed? ---Yes Related visit to physician within the last 2 weeks? ---No Does the PT have any chronic conditions? (i.e. diabetes, asthma, this includes High risk factors for pregnancy, etc.) ---Yes List chronic conditions. ---HTN- medicated allergies- on nasal sprays Is this a behavioral health or substance abuse call? ---No Guidelines Guideline Title Affirmed Question Affirmed Notes Nurse Date/Time (Eastern Time) Dizziness - Vertigo [1] Dizziness (vertigo) present now AND [2] one or more STROKE RISK FACTORS (i.e., Hardin Negus, RN, Danae Chen 12/19/2019 10:07:30 AMPLEASE NOTE: All timestamps contained within this report are represented as [place] Standard Time. CONFIDENTIALTY NOTICE: This fax transmission is intended only for the addressee. It contains information that is legally privileged, confidential or otherwise protected from use or disclosure. If you are not the intended recipient, you are strictly prohibited from reviewing, disclosing, copying using or disseminating any of this information or taking any action in reliance on or regarding this information. If you have received this fax in error, please notify us immediately by telephone so  that we can arrange for its return to [place]. Phone: [phone], Toll-Free: [phone], Fax: [phone] Page: 2 of 2 Call Id: [id number] Guidelines Guideline Title Affirmed Question Affirmed Notes Nurse Date/Time Eilene Ghazi Time) hypertension, diabetes, prior stroke/TIA, heart attack) (Exception: prior physician evaluation for this AND no different/ worse than usual) Disp. Time Eilene Ghazi Time) Disposition Final User 12/19/2019 10:12:01 AM Go to ED Now (or PCP triage) Yes Hardin Negus, RN, Dierdre Highman Disagree/Comply Comply Caller Understands Yes PreDisposition InappropriateToAsk Care Advice Given Per Guideline GO TO ED NOW (OR PCP TRIAGE): * IF NO PCP (PRIMARY CARE PROVIDER) SECOND-LEVEL TRIAGE: You need to be seen within the next hour. Go to the [facility] at _____________ [place] as soon as you can. NOTE TO TRIAGER - DRIVING: * Another adult should drive. BRING MEDICINES: CARE ADVICE given per Dizziness - Vertigo (Adult) guideline. * Please bring a list of your current medicines when you go to see the doctor. Comments User: Ferdinand Cava, RN Date/Time Eilene Ghazi Time): 12/19/2019 10:09:11 AM BP this morning was in 120's/80's User: Ferdinand Cava, RN Date/Time Eilene Ghazi Time): 12/19/2019 10:09:47 AM most recent BP 116/82 Referrals GO TO FACILITY UNDECIDE

## 2019-12-19 NOTE — Telephone Encounter (Signed)
This is a new issue.  He may just have vertigo but I recommend that he contact his PCP first.

## 2019-12-20 NOTE — Telephone Encounter (Signed)
Noted  

## 2019-12-20 NOTE — Telephone Encounter (Signed)
Patient states he seen a PA over at [facility] and states that he was prescribed  some medication for vertigo and nausea. Patient stated that if anything gets worse or if he is not better by Monday he will follow up with Dr.Hunter.

## 2019-12-25 ENCOUNTER — Telehealth: Payer: Self-pay | Admitting: Family Medicine

## 2019-12-25 NOTE — Telephone Encounter (Signed)
Patient is calling in wanting to follow up with Dr.Hunter about his vertigo, and is wanting to discuss a few things. Could we schedule patient for Friday morning in same day?

## 2019-12-25 NOTE — Telephone Encounter (Signed)
Ok to make app

## 2019-12-28 ENCOUNTER — Ambulatory Visit (INDEPENDENT_AMBULATORY_CARE_PROVIDER_SITE_OTHER): Payer: BC Managed Care – PPO | Admitting: Family Medicine

## 2019-12-28 ENCOUNTER — Encounter: Payer: Self-pay | Admitting: Family Medicine

## 2019-12-28 ENCOUNTER — Other Ambulatory Visit: Payer: Self-pay

## 2019-12-28 VITALS — BP 120/70 | HR 50 | Temp 98.2°F | Ht 70.0 in | Wt 171.2 lb

## 2019-12-28 DIAGNOSIS — I10 Essential (primary) hypertension: Secondary | ICD-10-CM | POA: Diagnosis not present

## 2019-12-28 DIAGNOSIS — H8112 Benign paroxysmal vertigo, left ear: Secondary | ICD-10-CM

## 2019-12-28 DIAGNOSIS — G43909 Migraine, unspecified, not intractable, without status migrainosus: Secondary | ICD-10-CM

## 2019-12-28 DIAGNOSIS — R5383 Other fatigue: Secondary | ICD-10-CM | POA: Diagnosis not present

## 2019-12-28 LAB — COMPREHENSIVE METABOLIC PANEL
ALT: 14 U/L (ref 0–53)
AST: 21 U/L (ref 0–37)
Albumin: 4.5 g/dL (ref 3.5–5.2)
Alkaline Phosphatase: 56 U/L (ref 39–117)
BUN: 15 mg/dL (ref 6–23)
CO2: 32 mEq/L (ref 19–32)
Calcium: 9.3 mg/dL (ref 8.4–10.5)
Chloride: 104 mEq/L (ref 96–112)
Creatinine, Ser: 1.06 mg/dL (ref 0.40–1.50)
GFR: 71.73 mL/min (ref >60.00)
Glucose, Bld: 86 mg/dL (ref 70–99)
Potassium: 4.2 mEq/L (ref 3.5–5.1)
Sodium: 139 mEq/L (ref 135–145)
Total Bilirubin: 0.8 mg/dL (ref 0.2–1.2)
Total Protein: 6.8 g/dL (ref 6.0–8.3)

## 2019-12-28 LAB — CBC WITH DIFFERENTIAL/PLATELET
Basophils Absolute: 0.1 10*3/uL (ref 0.0–0.1)
Basophils Relative: 1.3 % (ref 0.0–3.0)
Eosinophils Absolute: 0.1 10*3/uL (ref 0.0–0.7)
Eosinophils Relative: 3.3 % (ref 0.0–5.0)
HCT: 43.3 % (ref 39.0–52.0)
Hemoglobin: 14.5 g/dL (ref 13.0–17.0)
Lymphocytes Relative: 21.7 % (ref 12.0–46.0)
Lymphs Abs: 0.8 10*3/uL (ref 0.7–4.0)
MCHC: 33.4 g/dL (ref 30.0–36.0)
MCV: 94.3 fl (ref 78.0–100.0)
Monocytes Absolute: 0.4 10*3/uL (ref 0.1–1.0)
Monocytes Relative: 9 % (ref 3.0–12.0)
Neutro Abs: 2.5 10*3/uL (ref 1.4–7.7)
Neutrophils Relative %: 64.7 % (ref 43.0–77.0)
Platelets: 178 10*3/uL (ref 150.0–400.0)
RBC: 4.59 Mil/uL (ref 4.22–5.81)
RDW: 13.2 % (ref 11.5–15.5)
WBC: 3.9 10*3/uL — ABNORMAL LOW (ref 4.0–10.5)

## 2019-12-28 LAB — TSH: TSH: 2.6 u[IU]/mL (ref 0.35–4.50)

## 2019-12-28 LAB — VITAMIN D 25 HYDROXY (VIT D DEFICIENCY, FRACTURES): VITD: 36.05 ng/mL (ref 30.00–100.00)

## 2019-12-28 NOTE — Patient Instructions (Addendum)
Richrd Sox, MD Vertigo Treatment Oct 11 [email or web address]  Sit tight- we are going to try to go ahead and get you scheduled before you leave if possible- if not we will call but hoping to have you in early next week  Please stop by lab before you go If you have mychart- we will send your results within 3 business days of [place] receiving them.  If you do not have mychart- we will call you about results within 5 business days of [place] receiving them.

## 2019-12-28 NOTE — Progress Notes (Signed)
Phone [phone] In person visit   Subjective:   Matthew Fox is a 59 y.o. year old very pleasant male patient who presents for/with See problem oriented charting Chief Complaint  Patient presents with  . Follow-up  . vertigo   This visit occurred during the SARS-CoV-2 public health emergency.  Safety protocols were in place, including screening questions prior to the visit, additional usage of staff PPE, and extensive cleaning of exam room while observing appropriate contact time as indicated for disinfecting solutions.   Past Medical History-  Patient Active Problem List   Diagnosis Date Noted  . Allergic rhinitis 06/16/2017    Priority: Medium  . Migraine     Priority: Medium  . Hyperlipidemia     Priority: Medium  . Adenomatous colon polyp     Priority: Low  . Leukopenia     Priority: Low  . BPPV (benign paroxysmal positional vertigo), left 12/28/2019  . Essential hypertension 08/28/2019  . Medial epicondylitis of elbow, right 07/16/2019    Medications- reviewed and updated Current Outpatient Medications  Medication Sig Dispense Refill  . amLODipine (NORVASC) 2.5 MG tablet Take 1 tablet (2.5 mg total) by mouth daily. 90 tablet 3  . fexofenadine (ALLEGRA) 180 MG tablet Take 180 mg by mouth daily.    . meloxicam (MOBIC) 7.5 MG tablet Take 7.5 mg by mouth daily.     No current facility-administered medications for this visit.     Objective:  BP 120/70   Pulse (!) 50   Temp 98.2 F (36.8 C)   Ht 5\' 10"  (1.778 m)   Wt 171 lb 3.2 oz (77.7 kg)   SpO2 95%   BMI 24.56 kg/m  Gen: NAD, resting comfortably Oropharynx normal.  Tympanic membrane normal Bradycardic slightly but regular  neuro: CN II-XII intact, sensation and reflexes normal throughout, 5/5 muscle strength in bilateral upper and lower extremities. Normal finger to nose. Normal rapid alternating movements. No pronator drift. Normal romberg. Normal gait.   Dix-Hallpike maneuver with recurrent  vertigo and nystagmus noted on the left     Assessment and Plan   # Vertigo S: Patient was seen on December 19, 2019 at [facility] urgent care.  Note was reviewed and summarized as below.  Symptoms started the day prior to that visit when bending over-described a room spinning sensation and disequilibrium and feeling unsteady on his feet.  Seem to be triggered by bending over or looking down.  Associated with nausea and vomiting.  He was diagnosed with BPPV-had positive Dix-Hallpike maneuver to the left.  He was given meclizine and promethazine.  He was instructed on Epley maneuvers and other exercises for repositioning.  Symptoms are are improving but not as rapidly as he would like. Ever had similar symptoms: No  pt states vertigo is definitely better but its still here.  Patient does have a history of migraines-denies any recent headaches. Still very positional. Feels somewhat foggy with it. Tried exercises for a day or two and those didn't help. Daughter NP sent her some videos to work on but hasnt tried those. No vomiting since day 1. States feels like if you are spun around with pin the tail on donkey as a kid.   ROS- No facial or extremity weakness. No slurred words or trouble swallowing. no blurry vision or double vision. No paresthesias. No confusion or word finding difficulties- other than feels foggy with episodes No hearing loss  No tinnitus  No palpitations. No  chest pain or shortness  of breath.  No reported neck pain issues  A/P: Strongly suspect BPPV of the left semicircular canals.  Has failed conservative measures with meclizine and home exercises. -Neurological exam reassuring-doubt CVA or stroke -Do not strongly suspect vestibular migraines -No tinnitus or hearing loss-doubt Mnire's -No recent respiratory illness-doubt vestibular neuritis -We will refer to vestibular rehab-urgent referral placed -Can continue meclizine as needed -I gave some different home exercises that he  can use if he is not able to get in today-through Richrd Sox, MD ENT YouTube video -feels cold and like energy level is down in general- since starting BP meds-with this we will go ahead and check some blood work as well though do not think this is related to the vertigo  #hypertension S: medication: amlodipine 2.5mg  BP Readings from Last 3 Encounters:  12/28/19 120/70  12/05/19 138/70  08/28/19 130/76  A/P: Excellent control-continue current medications  # Migraine without status migrainosus S: Patient continues to do well since adding riboflavin and coenzyme Q10 and increasing water intake.  A/P: I strongly doubt vestibular migraine-patient with classic positive Dix-Hallpike on the left-we still could consider neurology or ENT referral if fails to improve with vestibular rehab or if physical therapist does not think this is BPPV  -I do not think patient needs neuroimaging with reassuring neurological exam at this time  Recommended follow up: Keep December physical Future Appointments  Date Time Provider [place]  06/25/2020  8:00 AM Marin Olp, MD [facility]   Lab/Order associations:   ICD-10-CM   1. Essential hypertension  I10   2. Migraine without status migrainosus, not intractable, unspecified migraine type  G43.909   3. BPPV (benign paroxysmal positional vertigo), left  H81.12 Ambulatory referral to Physical Therapy  4. Fatigue, unspecified type  R53.83 CBC with Differential/Platelet    Comprehensive metabolic panel    TSH    VITAMIN D 25 Hydroxy (Vit-D Deficiency, Fractures)   Return precautions advised.  Garret Reddish, MD

## 2020-01-01 DIAGNOSIS — R42 Dizziness and giddiness: Secondary | ICD-10-CM | POA: Diagnosis not present

## 2020-01-09 DIAGNOSIS — D485 Neoplasm of uncertain behavior of skin: Secondary | ICD-10-CM | POA: Diagnosis not present

## 2020-01-09 DIAGNOSIS — D225 Melanocytic nevi of trunk: Secondary | ICD-10-CM | POA: Diagnosis not present

## 2020-01-09 DIAGNOSIS — C44519 Basal cell carcinoma of skin of other part of trunk: Secondary | ICD-10-CM | POA: Diagnosis not present

## 2020-01-09 DIAGNOSIS — L578 Other skin changes due to chronic exposure to nonionizing radiation: Secondary | ICD-10-CM | POA: Diagnosis not present

## 2020-01-09 DIAGNOSIS — C44319 Basal cell carcinoma of skin of other parts of face: Secondary | ICD-10-CM | POA: Diagnosis not present

## 2020-01-09 DIAGNOSIS — D2371 Other benign neoplasm of skin of right lower limb, including hip: Secondary | ICD-10-CM | POA: Diagnosis not present

## 2020-01-15 ENCOUNTER — Other Ambulatory Visit: Payer: Self-pay | Admitting: Family Medicine

## 2020-01-28 DIAGNOSIS — C44319 Basal cell carcinoma of skin of other parts of face: Secondary | ICD-10-CM | POA: Diagnosis not present

## 2020-04-08 DIAGNOSIS — C44519 Basal cell carcinoma of skin of other part of trunk: Secondary | ICD-10-CM | POA: Diagnosis not present

## 2020-04-08 DIAGNOSIS — L988 Other specified disorders of the skin and subcutaneous tissue: Secondary | ICD-10-CM | POA: Diagnosis not present

## 2020-06-05 ENCOUNTER — Other Ambulatory Visit: Payer: Self-pay

## 2020-06-05 ENCOUNTER — Encounter: Payer: Self-pay | Admitting: Family Medicine

## 2020-06-05 MED ORDER — AMLODIPINE BESYLATE 5 MG PO TABS: 5.0000 mg | ORAL_TABLET | Freq: Every day | ORAL | 3 refills | Status: DC

## 2020-06-12 ENCOUNTER — Other Ambulatory Visit: Payer: Self-pay | Admitting: Family Medicine

## 2020-06-25 ENCOUNTER — Ambulatory Visit (INDEPENDENT_AMBULATORY_CARE_PROVIDER_SITE_OTHER): Payer: BC Managed Care – PPO | Admitting: Family Medicine

## 2020-06-25 ENCOUNTER — Other Ambulatory Visit: Payer: Self-pay

## 2020-06-25 ENCOUNTER — Encounter: Payer: Self-pay | Admitting: Family Medicine

## 2020-06-25 VITALS — BP 137/82 | HR 61 | Temp 97.9°F | Ht 70.0 in | Wt 173.0 lb

## 2020-06-25 DIAGNOSIS — Z0000 Encounter for general adult medical examination without abnormal findings: Secondary | ICD-10-CM

## 2020-06-25 DIAGNOSIS — Z125 Encounter for screening for malignant neoplasm of prostate: Secondary | ICD-10-CM

## 2020-06-25 DIAGNOSIS — E785 Hyperlipidemia, unspecified: Secondary | ICD-10-CM

## 2020-06-25 DIAGNOSIS — I10 Essential (primary) hypertension: Secondary | ICD-10-CM

## 2020-06-25 MED ORDER — AMLODIPINE BESYLATE 10 MG PO TABS
10.0000 mg | ORAL_TABLET | Freq: Every day | ORAL | 3 refills | Status: DC
Start: 2020-06-25 — End: 2021-06-01

## 2020-06-25 NOTE — Patient Instructions (Addendum)
Increase amlodipine to 10 mg. Also update [place] in 1 month with last 7 days of BP averages.   Team lets try orthostatic vital signs  We will call you within two weeks about your referral to coronary calcium scoring test. If you do not hear within 3 weeks, give [place] a call.   Please stop by lab before you go If you have mychart- we will send your results within 3 business days of [place] receiving them.  If you do not have mychart- we will call you about results within 5 business days of [place] receiving them.  *please note we are currently using [facility] which has a longer processing time than [facility] typically so labs may not come back as quickly as in the past *please also note that you will see labs on mychart as soon as they post. I will later go in and write notes on them- will say "notes from Dr. Yong Channel"   Recommended follow up: Return in about 6 months (around 12/24/2020) for follow up- or sooner if needed.

## 2020-06-25 NOTE — Progress Notes (Addendum)
Phone: [phone]   Subjective:  Patient presents today for their annual physical. Chief complaint-noted.   See problem oriented charting- ROS- full  review of systems was completed and negative  except for: seasonal allergies, dizziness  The following were reviewed and entered/updated in epic: Past Medical History:  Diagnosis Date  . Adenomatous colon polyp    2013, 5 year repeat  . Allergy   . HTN (hypertension)   . Hyperlipidemia    no rx. total just above 200, HDL great, LDL in 120s 130s  . Leukopenia    stable in the 3s  . Migraine    relpax about once a month   Patient Active Problem List   Diagnosis Date Noted  . Allergic rhinitis 06/16/2017    Priority: Medium  . Migraine     Priority: Medium  . Hyperlipidemia     Priority: Medium  . Adenomatous colon polyp     Priority: Low  . Leukopenia     Priority: Low  . BPPV (benign paroxysmal positional vertigo), left 12/28/2019  . Essential hypertension 08/28/2019  . Medial epicondylitis of elbow, right 07/16/2019   Past Surgical History:  Procedure Laterality Date  . ANAL FISSURE REPAIR  2007  . COLONOSCOPY W/ POLYPECTOMY    . HERNIA REPAIR     as infant; bilateral  . right elbow surgery    . VASECTOMY      Family History  Problem Relation Age of Onset  . Colon cancer Maternal Grandfather        early 95s  . Lung cancer Mother        smoker, died 47 age 69  . Heart failure Father        rheumatic fever-died 23 months after mother, did not take care of himself  . Stomach cancer Neg Hx     Medications- reviewed and updated Current Outpatient Medications  Medication Sig Dispense Refill  . amLODipine (NORVASC) 5 MG tablet Take 1 tablet (5 mg total) by mouth daily. 90 tablet 3  . fexofenadine (ALLEGRA) 180 MG tablet Take 180 mg by mouth daily.    . meloxicam (MOBIC) 7.5 MG tablet Take 7.5 mg by mouth daily. (Patient not taking: Reported on 06/25/2020)     No current facility-administered medications  for this visit.    Allergies-reviewed and updated No Known Allergies  Social History   Social History Narrative   Married (wife patient of Dr. 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 Gen: NAD, resting comfortably HEENT: Mucous membranes are moist. Oropharynx normal Neck: no thyromegaly CV: RRR no murmurs rubs or gallops Lungs: CTAB no crackles, wheeze, rhonchi Abdomen: soft/nontender/nondistended/normal bowel sounds. No rebound or guarding.  Ext: no edema Skin: warm, dry Neuro: grossly normal, moves all extremities, PERRLA    Assessment and Plan  59 y.o. male presenting for annual physical.  Health Maintenance counseling: 1. Anticipatory guidance: Patient counseled regarding regular dental exams -q6 months, eye exams - twice yearly as glaucoma suspect,  avoiding smoking and second hand smoke , limiting  alcohol to 2 beverages per day .   2. Risk factor reduction:  Advised patient of need for regular exercise and diet rich and fruits and vegetables to reduce risk of heart attack and stroke. Exercise-still running 6 days a week (16-20 miles a week). Diet-reasonably healthy diet. Recommended low saltcontent Wt Readings from Last 3 Encounters:  12/28/19 171 lb 3.2 oz (77.7 kg)  12/05/19 173 lb 12.8 oz (78.8 kg)  08/28/19 172 lb 6.4 oz (78.2 kg)  3. Immunizations/screenings/ancillary studies-already received flu shot  Immunization History  Administered Date(s) Administered  . Influenza Inj Mdck Quad Pf 04/25/2019  . Influenza Split 04/10/2012, 03/28/2013  .  Influenza,inj,Quad PF,6+ Mos 05/09/2015, 03/18/2016, 05/18/2018, 05/18/2019  . Influenza-Unspecified 04/18/2013, 05/13/2014, 04/11/2017, 05/18/2018, 04/23/2020  . PFIZER SARS-COV-2 Vaccination 10/04/2019, 10/29/2019, 05/22/2020  . Td 07/28/2007  . Tdap 06/19/2018  . Zoster Recombinat (Shingrix) 06/19/2018, 09/28/2018  4. Prostate cancer screening- low risk prior PSA trend update labs today Lab Results  Component Value Date   PSA 0.53 06/21/2019   PSA 0.52 06/19/2018   PSA 0.53 06/16/2017   5. Colon cancer screening - history of adenomatous polyp of the colon but not on last examination-we will stretch back to 10-year follow-up.  Last colonoscopy 2018 with 10-year repeat  6. Skin cancer screening-follows with Dr. Delman Cheadle 1-2 times a year depending on findings- had mohs in last year. advised regular sunscreen use. Denies worrisome, changing, or new skin lesions.  7.  Never smoker 8. STD screening - opts out-monogamous with wife  Status of chronic or acute concerns   #dizziness vs. Vertigo Patient with BPPV left ear June 2021 and was referred to vestibular rehab as well as given home exercises through ENT Richrd Sox, MD on YouTube -he reports still gets some issues when gets in and out of bed at night. Feels he could be more consistent with exercises- may need to go back to vestibular rehab. Also sounds like having some orthostatic symptoms- not sure if its been worse since increasing the amlodipine. He will let me know if worse on amlodipine 10 mg. Still pushing fluids around 80 oz a day.  -he will reach out to me if  Wants to get plugged back in with vestibular rehab -team will do orthostatic vital signs (diastolic down 10 and HR up 10) but likely still may push amlodipine dose up as long as symptoms dont worsen  #elbow healed up from last year- back to baseline.   #hypertension S: medication: Amlodipine 5 mg Home readings #s: average last 7 days 138/90 BP Readings from Last 3 Encounters:   06/25/20 137/82  12/28/19 120/70  12/05/19 138/70  A/P: mild poor control on home readings. Technically ok in office per [facility] but hed like to be more aggressive about stroke/MI protection- we opted to increase to 10 mg.   #hyperlipidemia  S: Medication: None Lab Results  Component Value Date   CHOL 237 (H) 06/21/2019   HDL 69.80 06/21/2019   LDLCALC 158 (H) 06/21/2019   LDLDIRECT 132.8 05/23/2013   TRIG 47.0 06/21/2019   CHOLHDL 3 06/21/2019   A/P: 10-year ASCVD risk of 8.7% in intermediate range.  I do think this will lower with lower blood pressure.  We discussed possibly getting more information with coronary calcium scoring and he is interested in this-this was ordered today.  We discussed if score is above 100 considering statin at this time and if below this potentially continue to work on diet/exercise/blood pressure control and reassess in 5  years.     #Migraines S: Sumatriptan in the past has made patient feel "off".  In the past has used 1500 mg of Tylenol and I have advised against this.  Heart rate was on the low end of normal so we did not think metoprolol or verapamil ideal.  He wanted neurology opinion before using Topamax or amitriptyline -Placed referral to neurology last year-they added riboFlavin and coenzyme every 10 and increase his water intake.  This significantly improved his headache pattern.  Amlodipine may also have helped. Also thinks claritin d coming off of this may have helped A/P: thrilled doing better- continue current meds   #Leukopenia-very mild baseline leukopenia since he was in his 18s.  All other cell lines been normal.  Monitor CBC Lab Results  Component Value Date   WBC 3.9 (L) 12/28/2019   HGB 14.5 12/28/2019   HCT 43.3 12/28/2019   MCV 94.3 12/28/2019   PLT 178.0 12/28/2019   #Allergic rhinitis-came off Flonase as glaucoma suspect prior to last years physical-thankfully symptoms did not worsen.  He alternates between Allegra and or allegra  type product.  Has used decongestant in the past but we have discussed can affect blood pressure so would ideally avoid.  NeilMed sinus rinse has also helped twice daily when needed- not needed lately.    Recommended follow up: Return in about 6 months (around 12/24/2020) for follow up- or sooner if needed.  Lab/Order associations: fasting   ICD-10-CM   1. Preventative health care  Z00.00 CBC With Differential/Platelet    COMPLETE METABOLIC PANEL WITH GFR    Lipid Panel w/reflex Direct LDL    PSA  2. Hyperlipidemia, unspecified hyperlipidemia type  E78.5 CBC With Differential/Platelet    COMPLETE METABOLIC PANEL WITH GFR    Lipid Panel w/reflex Direct LDL  3. Screening for prostate cancer  Z12.5 PSA  4. Essential hypertension  I10 CBC With Differential/Platelet    COMPLETE METABOLIC PANEL WITH GFR    Lipid Panel w/reflex Direct LDL    No orders of the defined types were placed in this encounter.   Return precautions advised.  Garret Reddish, MD

## 2020-06-26 LAB — CBC WITH DIFFERENTIAL/PLATELET
Absolute Monocytes: 371 cells/uL (ref 200–950)
Basophils Absolute: 31 cells/uL (ref 0–200)
Basophils Relative: 0.8 %
Eosinophils Absolute: 70 cells/uL (ref 15–500)
Eosinophils Relative: 1.8 %
HCT: 43.6 % (ref 38.5–50.0)
Hemoglobin: 14.7 g/dL (ref 13.2–17.1)
Lymphs Abs: 683 cells/uL — ABNORMAL LOW (ref 850–3900)
MCH: 31.3 pg (ref 27.0–33.0)
MCHC: 33.7 g/dL (ref 32.0–36.0)
MCV: 92.8 fL (ref 80.0–100.0)
MPV: 10.5 fL (ref 7.5–12.5)
Monocytes Relative: 9.5 %
Neutro Abs: 2746 cells/uL (ref 1500–7800)
Neutrophils Relative %: 70.4 %
Platelets: 181 10*3/uL (ref 140–400)
RBC: 4.7 10*6/uL (ref 4.20–5.80)
RDW: 11.8 % (ref 11.0–15.0)
Total Lymphocyte: 17.5 %
WBC: 3.9 10*3/uL (ref 3.8–10.8)

## 2020-06-26 LAB — COMPLETE METABOLIC PANEL WITH GFR
AG Ratio: 1.8 (calc) (ref 1.0–2.5)
ALT: 13 U/L (ref 9–46)
AST: 19 U/L (ref 10–35)
Albumin: 4.3 g/dL (ref 3.6–5.1)
Alkaline phosphatase (APISO): 51 U/L (ref 35–144)
BUN: 16 mg/dL (ref 7–25)
CO2: 31 mmol/L (ref 20–32)
Calcium: 9.1 mg/dL (ref 8.6–10.3)
Chloride: 105 mmol/L (ref 98–110)
Creat: 0.9 mg/dL (ref 0.70–1.33)
GFR, Est African American: 109 mL/min/{1.73_m2} (ref >=60)
GFR, Est Non African American: 94 mL/min/{1.73_m2} (ref >=60)
Globulin: 2.4 g/dL (calc) (ref 1.9–3.7)
Glucose, Bld: 91 mg/dL (ref 65–99)
Potassium: 4.3 mmol/L (ref 3.5–5.3)
Sodium: 140 mmol/L (ref 135–146)
Total Bilirubin: 0.6 mg/dL (ref 0.2–1.2)
Total Protein: 6.7 g/dL (ref 6.1–8.1)

## 2020-06-26 LAB — LIPID PANEL W/REFLEX DIRECT LDL
Cholesterol: 223 mg/dL — ABNORMAL HIGH (ref <200)
HDL: 73 mg/dL (ref >=40)
LDL Cholesterol (Calc): 136 mg/dL (calc) — ABNORMAL HIGH
Non-HDL Cholesterol (Calc): 150 mg/dL (calc) — ABNORMAL HIGH (ref <130)
Total CHOL/HDL Ratio: 3.1 (calc) (ref <5.0)
Triglycerides: 46 mg/dL (ref <150)

## 2020-06-26 LAB — PSA: PSA: 0.54 ng/mL (ref <=4.0)

## 2020-07-14 ENCOUNTER — Encounter: Payer: Self-pay | Admitting: Physician Assistant

## 2020-07-14 ENCOUNTER — Other Ambulatory Visit: Payer: BC Managed Care – PPO

## 2020-07-14 ENCOUNTER — Telehealth (INDEPENDENT_AMBULATORY_CARE_PROVIDER_SITE_OTHER): Payer: BC Managed Care – PPO | Admitting: Physician Assistant

## 2020-07-14 VITALS — Ht 70.0 in | Wt 170.0 lb

## 2020-07-14 DIAGNOSIS — R6889 Other general symptoms and signs: Secondary | ICD-10-CM | POA: Diagnosis not present

## 2020-07-14 DIAGNOSIS — U071 COVID-19: Secondary | ICD-10-CM | POA: Diagnosis not present

## 2020-07-14 DIAGNOSIS — Z20822 Contact with and (suspected) exposure to covid-19: Secondary | ICD-10-CM | POA: Diagnosis not present

## 2020-07-14 MED ORDER — OSELTAMIVIR PHOSPHATE 75 MG PO CAPS: 75.0000 mg | ORAL_CAPSULE | Freq: Two times a day (BID) | ORAL | 0 refills | Status: DC

## 2020-07-14 NOTE — Progress Notes (Signed)
Virtual Visit via Video   I connected with Matthew Fox on 07/14/20 at 12:00 PM EST by a video enabled telemedicine application and verified that I am speaking with the correct person using two identifiers. Location patient: Home Location provider: [facility], Office Persons participating in the virtual visit: Gianni, Mihalik PA-C, Corky Mull, LPN   I discussed the limitations of evaluation and management by telemedicine and the availability of in person appointments. The patient expressed understanding and agreed to proceed.  I acted as a Neurosurgeon for [employer], PA-C [employer], LPN   Subjective:   HPI:   Patient is requesting evaluation for possible COVID-19.  Symptom onset: Saturday night  Travel/contacts: possible exposure due to holiday gatherings but no one has reported positive test to him  Vaccination status: yes  Patient endorses the following symptoms: sinus congestion, rhinorrhea, dry cough (non-productive but hears rattling.) and myalgia, body aches  Patient denies the following symptoms: Fever (none), sinus headache, ear pain, difficulty swallowing, wheezing, shortness of breath, chest tightness and chest pain  Treatments tried: Tylenol  Patient risk factors: Current COVID-19 risk of complications score: 2 Smoking status: Matthew Fox  reports that he has never smoked. He has never used smokeless tobacco. If male, currently pregnant? []   Yes []   No  ROS: See pertinent positives and negatives per HPI.  Patient Active Problem List   Diagnosis Date Noted   BPPV (benign paroxysmal positional vertigo), left 12/28/2019   Essential hypertension 08/28/2019   Medial epicondylitis of elbow, right 07/16/2019   Allergic rhinitis 06/16/2017   Adenomatous colon polyp    Migraine    Hyperlipidemia    Leukopenia     Social History   Tobacco Use   Smoking status: Never Smoker   Smokeless tobacco: Never  Used  Substance Use Topics   Alcohol use: Yes    Alcohol/week: 7.0 standard drinks    Types: 7 Standard drinks or equivalent per week    Current Outpatient Medications:    amLODipine (NORVASC) 10 MG tablet, Take 1 tablet (10 mg total) by mouth daily., Disp: 90 tablet, Rfl: 3   fexofenadine (ALLEGRA) 180 MG tablet, Take 180 mg by mouth daily., Disp: , Rfl:    oseltamivir (TAMIFLU) 75 MG capsule, Take 1 capsule (75 mg total) by mouth 2 (two) times daily., Disp: 10 capsule, Rfl: 0  No Known Allergies  Objective:   VITALS: Per patient if applicable, see vitals. GENERAL: Alert, appears well and in no acute distress. HEENT: Atraumatic, conjunctiva clear, no obvious abnormalities on inspection of external nose and ears. NECK: Normal movements of the head and neck. CARDIOPULMONARY: No increased WOB. Speaking in clear sentences. I:E ratio WNL.  MS: Moves all visible extremities without noticeable abnormality. PSYCH: Pleasant and cooperative, well-groomed. Speech normal rate and rhythm. Affect is appropriate. Insight and judgement are appropriate. Attention is focused, linear, and appropriate.  NEURO: CN grossly intact. Oriented as arrived to appointment on time with no prompting. Moves both UE equally.  SKIN: No obvious lesions, wounds, erythema, or cyanosis noted on face or hands.  Assessment and Plan:   Jerrard was seen today for covid symptoms.  Diagnoses and all orders for this visit:  Flu-like symptoms -     POC Influenza A&B (Binax test); Future  Other orders -     oseltamivir (TAMIFLU) 75 MG capsule; Take 1 capsule (75 mg total) by mouth 2 (two) times daily.    No red flags on discussion,  patient is not in any obvious distress during our visit. Discussed progression of most viral illness, and recommended supportive care at this point in time. Patient is scheduled to come to the office this afternoon for flu and covid testing. I have sent in Tamiflu for him to start  should his flu test be positive. Discussed over the counter supportive care options, with recommendations to push fluids and rest. Reviewed return precautions including new/worsening fever, SOB, new/worsening cough or other concerns.  Recommended need to self-quarantine and practice social distancing until symptoms resolve. I recommend that patient follow-up if symptoms worsen or persist despite treatment x 7-10 days, sooner if needed.  I discussed the assessment and treatment plan with the patient. The patient was provided an opportunity to ask questions and all were answered. The patient agreed with the plan and demonstrated an understanding of the instructions.   The patient was advised to call back or seek an in-person evaluation if the symptoms worsen or if the condition fails to improve as anticipated.   CMA or LPN served as scribe during this visit. History, Physical, and Plan performed by medical provider. The above documentation has been reviewed and is accurate and complete.   [place], [place] 07/14/2020

## 2020-07-15 ENCOUNTER — Encounter: Payer: Self-pay | Admitting: Physician Assistant

## 2020-07-15 LAB — NOVEL CORONAVIRUS, NAA: SARS-CoV-2, NAA: DETECTED — AB

## 2020-07-15 LAB — SARS-COV-2, NAA 2 DAY TAT

## 2020-07-20 ENCOUNTER — Encounter: Payer: Self-pay | Admitting: Physician Assistant

## 2020-07-29 ENCOUNTER — Other Ambulatory Visit: Payer: Self-pay

## 2020-07-29 ENCOUNTER — Encounter: Payer: Self-pay | Admitting: Family Medicine

## 2020-07-29 ENCOUNTER — Telehealth (INDEPENDENT_AMBULATORY_CARE_PROVIDER_SITE_OTHER): Payer: BC Managed Care – PPO | Admitting: Family Medicine

## 2020-07-29 DIAGNOSIS — R058 Other specified cough: Secondary | ICD-10-CM | POA: Diagnosis not present

## 2020-07-29 MED ORDER — BENZONATATE 100 MG PO CAPS: 100.0000 mg | ORAL_CAPSULE | Freq: Three times a day (TID) | ORAL | 0 refills | Status: DC | PRN

## 2020-07-29 MED ORDER — PROMETHAZINE-DM 6.25-15 MG/5ML PO SYRP: 5.0000 mL | ORAL_SOLUTION | Freq: Four times a day (QID) | ORAL | 0 refills | Status: DC | PRN

## 2020-07-29 MED ORDER — FLOVENT HFA 110 MCG/ACT IN AERO: 2.0000 | INHALATION_SPRAY | Freq: Two times a day (BID) | RESPIRATORY_TRACT | 1 refills | Status: DC

## 2020-07-29 NOTE — Progress Notes (Signed)
Chief Complaint  Patient presents with  . Covid Positive    Tested on 07/14/20  Was positive. Still not feeling well.  . Cough    Matthew Fox here for URI complaints. Due to COVID-19 pandemic, we are interacting via web portal for an electronic face-to-face visit. I verified patient's ID using 2 identifiers. Patient agreed to proceed with visit via this method. Patient is at home, I am at office. Patient and I are present for visit.     Duration: 2.5 weeks; worsened over past few days  Associated symptoms: coughing worsened Denies: sinus congestion, sinus pain, rhinorrhea, itchy watery eyes, ear pain, ear drainage, sore throat, wheezing, shortness of breath, myalgia, N/V/D, and fevers Treatment to date: Hall's cough drops, Mucinex DM, Delsym Sick contacts: No  Tested positive for covid  Past Medical History:  Diagnosis Date  . Adenomatous colon polyp    2013, 5 year repeat  . Allergy   . HTN (hypertension)   . Hyperlipidemia    no rx. total just above 200, HDL great, LDL in 120s 130s  . Leukopenia    stable in the 3s  . Migraine    relpax about once a month   Exam No conversational dyspnea Age appropriate judgment and insight Nml affect and mood  Post-viral cough syndrome - Plan: fluticasone (FLOVENT HFA) 110 MCG/ACT inhaler, promethazine-dextromethorphan (PROMETHAZINE-DM) 6.25-15 MG/5ML syrup, benzonatate (TESSALON) 100 MG capsule  Start ICS. Rinse mouth out after use. Symptomatic control with above, warnings about drowsiness with the syrup verbalized.  Continue to push fluids, practice good hand hygiene, cover mouth when coughing. F/u prn. If starting to experience fevers, shaking, or shortness of breath, seek immediate care. Pt voiced understanding and agreement to the plan.  [place], DO 07/29/20 11:40 AM

## 2020-07-31 ENCOUNTER — Telehealth: Payer: Self-pay

## 2020-07-31 ENCOUNTER — Ambulatory Visit (INDEPENDENT_AMBULATORY_CARE_PROVIDER_SITE_OTHER): Payer: BC Managed Care – PPO

## 2020-07-31 ENCOUNTER — Other Ambulatory Visit: Payer: Self-pay

## 2020-07-31 ENCOUNTER — Other Ambulatory Visit: Payer: BC Managed Care – PPO

## 2020-07-31 DIAGNOSIS — R059 Cough, unspecified: Secondary | ICD-10-CM | POA: Diagnosis not present

## 2020-07-31 DIAGNOSIS — U071 COVID-19: Secondary | ICD-10-CM

## 2020-07-31 NOTE — Patient Instructions (Addendum)
We will call you within two weeks about your referral to PT for vestibular rehab. If you do not hear within 2 weeks, give [place] a call.   Continue steroid since that seems to be helping. Also continue tessalon and cough medicine at night- suppressing cough is reasonable at this point as your cough center has been very irritated  If recurrent sinus pressure that lasts more than 7-10 days we need to consider an antibiotic as well.   Hoping everything resolves within 4 weeks if not sooner  Recommended follow up: keep June visit unless you need [place] sooner

## 2020-07-31 NOTE — Telephone Encounter (Signed)
You can order cxr under covid if so- 2 view x-ray PA and lateral

## 2020-07-31 NOTE — Telephone Encounter (Signed)
Patient has been scheduled for his Chest xray and his visit tomorrow.

## 2020-07-31 NOTE — Telephone Encounter (Signed)
Please see below, If it's okay I will get him scheduled to asap.

## 2020-07-31 NOTE — Telephone Encounter (Signed)
As long as patient has at least had significant improvement in symptoms, I am okay scheduling him for a visit.  Perhaps we could get an x-ray at the end of the day today (just to be extra careful with covid) and then schedule him for an 1140 tomorrow with me?

## 2020-07-31 NOTE — Progress Notes (Signed)
Phone [phone] In person visit   Subjective:   Matthew Fox is a 60 y.o. year old very pleasant male patient who presents for/with See problem oriented charting Chief Complaint  Patient presents with  . discussion    Discuss chest xray    This visit occurred during the SARS-CoV-2 public health emergency.  Safety protocols were in place, including screening questions prior to the visit, additional usage of staff PPE, and extensive cleaning of exam room while observing appropriate contact time as indicated for disinfecting solutions.   Past Medical History-  Patient Active Problem List   Diagnosis Date Noted  . Allergic rhinitis 06/16/2017    Priority: Medium  . Migraine     Priority: Medium  . Hyperlipidemia     Priority: Medium  . Adenomatous colon polyp     Priority: Low  . Leukopenia     Priority: Low  . BPPV (benign paroxysmal positional vertigo), left 12/28/2019  . Essential hypertension 08/28/2019  . Medial epicondylitis of elbow, right 07/16/2019    Medications- reviewed and updated Current Outpatient Medications  Medication Sig Dispense Refill  . amLODipine (NORVASC) 10 MG tablet Take 1 tablet (10 mg total) by mouth daily. 90 tablet 3  . benzonatate (TESSALON) 100 MG capsule Take 1 capsule (100 mg total) by mouth 3 (three) times daily as needed. 30 capsule 0  . fexofenadine (ALLEGRA) 180 MG tablet Take 180 mg by mouth daily.    . fluticasone (FLOVENT HFA) 110 MCG/ACT inhaler Inhale 2 puffs into the lungs in the morning and at bedtime. 1 each 1  . promethazine-dextromethorphan (PROMETHAZINE-DM) 6.25-15 MG/5ML syrup Take 5 mLs by mouth 4 (four) times daily as needed for cough. 118 mL 0   No current facility-administered medications for this visit.     Objective:  BP 126/90   Pulse (!) 56   Temp 97.7 F (36.5 C) (Temporal)   Ht 5\' 10"  (1.778 m)   Wt 171 lb 9.6 oz (77.8 kg)   SpO2 93%   BMI 24.62 kg/m  Gen: NAD, resting comfortably CV: RRR no  murmurs rubs or gallops Lungs: CTAB no crackles, wheeze, rhonchi  Ext: no edema Skin: warm, dry     Assessment and Plan   # Covid 19 S: Patient started with symptoms around 25 December saw Inda Coke and symptoms noted included sinus congestion, rhinorrhea, dry cough but with rattling, myalgias, body aches.  Tamiflu was planned and flu test was positive but test ended up being negative for influenza.  He tested positive for COVID-19 on 07/14/2020.  Of note patient was fully vaccinated and boosted from COVID-19 with Pfizer  First week severe symptoms as noted above, 2nd week had improved even back to work and then end of last week symptoms were worsening. Was getting clogged up in nose (now better) and symptoms worsened with deep cough- severe recurrent cough. Was using Halls cough drops. 20 years ago had flu/pneumonia and that worried him.  He reports retesting for COVID-19 on January 8 which was negative  Patient had a follow-up visit with Dr. Nani Ravens on January 11- he was prescribed Flovent, promethazine with dextromethorphan, Tessalon for postviral cough syndrome.  With this regimen he has started to feel slightly better-felt well enough to sleep last night for the first time in a while.  He reached out to [place] yesterday before he had a good night of sleep and woke up feeling better today.  Yesterday we opted to get a chest x-ray which thankfully  showed no pneumonia-I reviewed images with patient.  He is feeling much better today-in fact he had very minimal cough during our visit.  He feels like the medications are helpful-he is brushing his teeth/rinsing mouth out after Flovent A/P: COVID-19 diagnosis December 25-this seems like a post viral cough syndrome versus patient could have come in contact with a different virus that may have caused bronchitis like symptoms-regardless he is improving at this point.  I suspect symptoms will improve completely within 4 weeks but hopeful will improve  sooner than that.  We opted to continue current medication for now   #BPPV in past- had seen PT previously but by time he was seen had cleared up- has had some intermittent issues last 2-3 months- he would like to go back. Positional in nature like getting down on looking up or getting in and out of bed -She would like to return to see physical therapist/vestibular rehab-referral was placed today.  He is out of his quarantine.  So he can return at this point to therapist  #Diastolic blood pressure just a hair high today- patient has not recently been ill and we will monitor and recheck at follow-up   Recommended follow up: Keep June visit Future Appointments  Date Time Provider [place]  01/06/2021  8:40 AM Marin Olp, MD [facility]  06/30/2021  8:00 AM Marin Olp, MD [facility]    Lab/Order associations:   ICD-10-CM   1. COVID-19  U07.1   2. BPPV (benign paroxysmal positional vertigo), left  H81.12 Ambulatory referral to Physical Therapy   Return precautions advised.  Garret Reddish, MD

## 2020-07-31 NOTE — Telephone Encounter (Signed)
Patient called in saying he did a visit with [facility] last week, but is not doing any better. Matthew Fox is wondering if he can come in and talk with Dr.Hunter and possibly get an x-ray done.

## 2020-08-01 ENCOUNTER — Encounter: Payer: Self-pay | Admitting: Family Medicine

## 2020-08-01 ENCOUNTER — Ambulatory Visit (INDEPENDENT_AMBULATORY_CARE_PROVIDER_SITE_OTHER): Payer: BC Managed Care – PPO | Admitting: Family Medicine

## 2020-08-01 VITALS — BP 126/90 | HR 56 | Temp 97.7°F | Ht 70.0 in | Wt 171.6 lb

## 2020-08-01 DIAGNOSIS — H8112 Benign paroxysmal vertigo, left ear: Secondary | ICD-10-CM

## 2020-08-01 DIAGNOSIS — U071 COVID-19: Secondary | ICD-10-CM

## 2020-08-13 ENCOUNTER — Telehealth: Payer: Self-pay

## 2020-08-13 ENCOUNTER — Other Ambulatory Visit: Payer: Self-pay

## 2020-08-13 DIAGNOSIS — H8112 Benign paroxysmal vertigo, left ear: Secondary | ICD-10-CM

## 2020-08-13 NOTE — Telephone Encounter (Signed)
Pt is scheduled for feb 7 with [facility]. [facility] is asking for a referral

## 2020-08-13 NOTE — Telephone Encounter (Signed)
Referral placed.

## 2020-08-14 ENCOUNTER — Other Ambulatory Visit: Payer: Self-pay

## 2020-08-14 ENCOUNTER — Ambulatory Visit (INDEPENDENT_AMBULATORY_CARE_PROVIDER_SITE_OTHER)
Admission: RE | Admit: 2020-08-14 | Discharge: 2020-08-14 | Disposition: A | Discharge status: Home / Self Care | Payer: Self-pay | Source: Ambulatory Visit | Attending: Family Medicine | Admitting: Family Medicine

## 2020-08-14 DIAGNOSIS — E785 Hyperlipidemia, unspecified: Secondary | ICD-10-CM

## 2020-08-17 ENCOUNTER — Encounter: Payer: Self-pay | Admitting: Family Medicine

## 2020-08-18 ENCOUNTER — Other Ambulatory Visit: Payer: Self-pay

## 2020-08-18 MED ORDER — ROSUVASTATIN CALCIUM 10 MG PO TABS: 10.0000 mg | ORAL_TABLET | Freq: Every day | ORAL | 3 refills | Status: DC

## 2020-08-18 MED ORDER — VALSARTAN 80 MG PO TABS: 80.0000 mg | ORAL_TABLET | Freq: Every day | ORAL | 5 refills | Status: DC

## 2020-08-18 NOTE — Telephone Encounter (Signed)
LVM for patient to call back and schedule a f/u with Dr.Hunter.

## 2020-08-18 NOTE — Telephone Encounter (Signed)
Patient has been scheduled for the February 17th.

## 2020-08-25 DIAGNOSIS — H8112 Benign paroxysmal vertigo, left ear: Secondary | ICD-10-CM | POA: Diagnosis not present

## 2020-08-26 ENCOUNTER — Ambulatory Visit: Payer: BC Managed Care – PPO | Admitting: Family Medicine

## 2020-09-03 NOTE — Patient Instructions (Addendum)
Please stop by lab before you go If you have mychart- we will send your results within 3 business days of [place] receiving them.  If you do not have mychart- we will call you about results within 5 business days of [place] receiving them.  *please also note that you will see labs on mychart as soon as they post. I will later go in and write notes on them- will say "notes from Dr. Yong Channel"  -possible aortic aneurysm- we will retest in 6 months- I am listing on records due to initial test avoid quinolone antibiotics if possible (levaquin/levofloxacin/ciprofloxacin)  Blood pressure looks great in office- ideally keep top # under 120  No changes today unless labs lead [place] to make changes  Recommended follow up: Return in about 6 months (around 03/04/2021) for follow up- or sooner if needed.

## 2020-09-03 NOTE — Progress Notes (Signed)
Phone [phone] In person visit   Subjective:   Matthew Fox is a 60 y.o. year old very pleasant male patient who presents for/with See problem oriented charting Chief Complaint  Patient presents with  . Medication Follow Up    Follow up on the new medications that you started him on    This visit occurred during the SARS-CoV-2 public health emergency.  Safety protocols were in place, including screening questions prior to the visit, additional usage of staff PPE, and extensive cleaning of exam room while observing appropriate contact time as indicated for disinfecting solutions.   Past Medical History-  Patient Active Problem List   Diagnosis Date Noted  . Thoracic aortic aneurysm ([place]) 09/04/2020    Priority: High  . BPPV (benign paroxysmal positional vertigo), left 12/28/2019    Priority: Medium  . Essential hypertension 08/28/2019    Priority: Medium  . Allergic rhinitis 06/16/2017    Priority: Medium  . Migraine     Priority: Medium  . Hyperlipidemia     Priority: Medium  . Adenomatous colon polyp     Priority: Low  . Leukopenia     Priority: Low  . Medial epicondylitis of elbow, right 07/16/2019    Medications- reviewed and updated Current Outpatient Medications  Medication Sig Dispense Refill  . amLODipine (NORVASC) 10 MG tablet Take 1 tablet (10 mg total) by mouth daily. 90 tablet 3  . fexofenadine (ALLEGRA) 180 MG tablet Take 180 mg by mouth daily.    . rosuvastatin (CRESTOR) 10 MG tablet Take 1 tablet (10 mg total) by mouth daily. 90 tablet 3  . valsartan (DIOVAN) 80 MG tablet Take 1 tablet (80 mg total) by mouth daily. 30 tablet 5  . fluticasone (FLOVENT HFA) 110 MCG/ACT inhaler Inhale 2 puffs into the lungs in the morning and at bedtime. (Patient not taking: Reported on 09/04/2020) 1 each 1   No current facility-administered medications for this visit.     Objective:  BP 104/76   Pulse (!) 57   Temp 97.9 F (36.6 C) (Temporal)   Ht 5\' 10"   (1.778 m)   Wt 174 lb 9.6 oz (79.2 kg)   SpO2 97%   BMI 25.05 kg/m  Gen: NAD, resting comfortably CV: RRR no murmurs rubs or gallops Lungs: CTAB no crackles, wheeze, rhonchi Abdomen: soft/nontender/nondistended/normal bowel sounds.  Ext: no edema Skin: warm, dry    Assessment and Plan   # patient with covid in December- had follow up in January- feels like he is fully over this  # aortic aneurysm thoracic incidental S: Incidental finding on imaging for coronary calcium scoring at 42 mm in January 2022. A/P: we discussed possible that he does not actually have an aneurysm- we will need more advanced imaging but we opted not to rush into this -6 months CT angiogram repeat planned -still will be aggressive with Systolic blood pressure goal 105-120 ideally but dont want to push too low  #hypertension-more aggressive blood pressure goal due to potential aneurysm S: medication: amlodipine 10 mg, valsartan 80mg  Home readings #s: variability as low as 101 but can get into 140s Over last 10 readings avg 124.5 78.3  A/P: with variability I do not think we can push him lower- well controlled in office at 104/76- continue current medication. No orthostatic issues - monitor renal function with new ARB and make sure less than 30% increase in creatinine   #hyperlipidemia with coronary calcium score of 68 but focus of calcium in proximal LAD.  LDL before starting medication 136 S: Medication:Rosuvastatin 10 mg. Some exercise intolerance - pushes through. Some muscle aches  A/P: hoping LDL <70 at follow up- too soon to recheck- will do at 6 months  #BPPV starting 2021- referred to vestibular rehab in January 2022- reports had visit 2 weeks ago- doing home exercises  And seems to be doing a little better.   Recommended follow up: Return in about 6 months (around 03/04/2021) for follow up- or sooner if needed. Future Appointments  Date Time Provider [place]  01/06/2021  8:40 AM Marin Olp, MD [facility]  06/30/2021  8:00 AM Marin Olp, MD [facility]   Lab/Order associations:   ICD-10-CM   1. Essential hypertension  P23 Basic metabolic panel  2. Thoracic aortic aneurysm without rupture (HCC)  I71.2   3. Hyperlipidemia, unspecified hyperlipidemia type  E78.5     No orders of the defined types were placed in this encounter.  Return precautions advised.  Garret Reddish, MD

## 2020-09-04 ENCOUNTER — Ambulatory Visit (INDEPENDENT_AMBULATORY_CARE_PROVIDER_SITE_OTHER): Payer: BC Managed Care – PPO | Admitting: Family Medicine

## 2020-09-04 ENCOUNTER — Other Ambulatory Visit: Payer: Self-pay

## 2020-09-04 ENCOUNTER — Encounter: Payer: Self-pay | Admitting: Family Medicine

## 2020-09-04 VITALS — BP 104/76 | HR 57 | Temp 97.9°F | Ht 70.0 in | Wt 174.6 lb

## 2020-09-04 DIAGNOSIS — E785 Hyperlipidemia, unspecified: Secondary | ICD-10-CM

## 2020-09-04 DIAGNOSIS — I10 Essential (primary) hypertension: Secondary | ICD-10-CM | POA: Diagnosis not present

## 2020-09-04 DIAGNOSIS — I712 Thoracic aortic aneurysm, without rupture, unspecified: Secondary | ICD-10-CM

## 2020-09-04 LAB — BASIC METABOLIC PANEL
BUN: 15 mg/dL (ref 6–23)
CO2: 30 mEq/L (ref 19–32)
Calcium: 9.3 mg/dL (ref 8.4–10.5)
Chloride: 105 mEq/L (ref 96–112)
Creatinine, Ser: 1.13 mg/dL (ref 0.40–1.50)
GFR: 71.5 mL/min (ref >60.00)
Glucose, Bld: 88 mg/dL (ref 70–99)
Potassium: 4.3 mEq/L (ref 3.5–5.1)
Sodium: 138 mEq/L (ref 135–145)

## 2020-09-04 NOTE — Assessment & Plan Note (Signed)
#  hyperlipidemia with coronary calcium score of 68 but focus of calcium in proximal LAD.  LDL before starting medication 136 S: Medication:Rosuvastatin 10 mg. Some exercise intolerance - pushes through. Some muscle aches  A/P: hoping LDL <70 at follow up- too soon to recheck- will do at 6 months

## 2020-09-04 NOTE — Assessment & Plan Note (Signed)
#  hypertension-more aggressive blood pressure goal due to potential aneurysm S: medication: amlodipine 10 mg, valsartan 80mg  Home readings #s: variability as low as 101 but can get into 140s Over last 10 readings avg 124.5 78.3  A/P: with variability I do not think we can push him lower- well controlled in office at 104/76- continue current medication. No orthostatic issues

## 2020-12-09 ENCOUNTER — Encounter: Payer: Self-pay | Admitting: Internal Medicine

## 2021-01-05 ENCOUNTER — Encounter: Payer: Self-pay | Admitting: *Deleted

## 2021-01-06 ENCOUNTER — Ambulatory Visit: Payer: BC Managed Care – PPO | Admitting: Family Medicine

## 2021-01-23 ENCOUNTER — Ambulatory Visit (INDEPENDENT_AMBULATORY_CARE_PROVIDER_SITE_OTHER): Payer: BC Managed Care – PPO | Admitting: Internal Medicine

## 2021-01-23 ENCOUNTER — Encounter: Payer: Self-pay | Admitting: Internal Medicine

## 2021-01-23 ENCOUNTER — Other Ambulatory Visit: Payer: Self-pay | Admitting: Internal Medicine

## 2021-01-23 VITALS — BP 100/68 | HR 53 | Ht 70.0 in | Wt 172.0 lb

## 2021-01-23 DIAGNOSIS — R0989 Other specified symptoms and signs involving the circulatory and respiratory systems: Secondary | ICD-10-CM

## 2021-01-23 DIAGNOSIS — K219 Gastro-esophageal reflux disease without esophagitis: Secondary | ICD-10-CM | POA: Diagnosis not present

## 2021-01-23 DIAGNOSIS — R198 Other specified symptoms and signs involving the digestive system and abdomen: Secondary | ICD-10-CM | POA: Diagnosis not present

## 2021-01-23 MED ORDER — PANTOPRAZOLE SODIUM 40 MG PO TBEC: 40.0000 mg | DELAYED_RELEASE_TABLET | Freq: Every day | ORAL | 1 refills | Status: DC

## 2021-01-23 NOTE — Patient Instructions (Signed)
We have sent the following medications to your pharmacy for you to pick up at your convenience: Pantoprazole 40 mg once daily x 1 month (1 additional refill)  Please contact us by phone or mychart message to advise [place] if the pantoprazole is helping your symptoms.  If you are age 59 or younger, your body mass index should be between 19-25. Your Body mass index is 24.68 kg/m. If this is out of the aformentioned range listed, please consider follow up with your Primary Care Provider.   __________________________________________________________  The [facility] providers would like to encourage you to use [facility] to communicate with providers for non-urgent requests or questions.  Due to long hold times on the telephone, sending your provider a message by [facility] may be a faster and more efficient way to get a response.  Please allow 48 business hours for a response.  Please remember that this is for non-urgent requests.   Due to recent changes in healthcare laws, you may see the results of your imaging and laboratory studies on MyChart before your provider has had a chance to review them.  We understand that in some cases there may be results that are confusing or concerning to you. Not all laboratory results come back in the same time frame and the provider may be waiting for multiple results in order to interpret others.  Please give [place] 48 hours in order for your provider to thoroughly review all the results before contacting the office for clarification of your results.

## 2021-01-23 NOTE — Progress Notes (Signed)
Patient ID: Matthew Fox, male   DOB: Feb 26, 1961, 60 y.o.   MRN: [id number] HPI: Dayron Odland is a 60 year old male with a history of nonadvanced adenomatous colon polyp, diverticulosis, hypertension, hyperlipidemia and migraines who is seen in consult at the request of Dr. Yong Channel to evaluate reflux and LPR type symptoms.  He is here alone today.  He reports that he has noticed over time that he has frequent throat clearing as well as a dry cough.  He also reports globus sensation which is present nearly every day.  He has heartburn 1 or 2 times per week usually in the evening.  His appetite has been good and weight stable.  No nausea or vomiting.  He has tried Pepcid but only on an as needed basis and has not been consistent with this therapy.  He denies solid food dysphagia but at times feels like his swallowing is not always normal.  He has a hard time describing this but thinks that it is related to his globus sensation.  Symptoms worsen during times of stress at work or when he feels nervous.  He also deals with nasal congestion which he treats with previously Allegra but most recently Claritin.  He also does sinus rinse twice daily.  Bowel habits regular.  No blood in stool or melena.  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  Past Medical History:  Diagnosis Date   Adenomatous colon polyp    2013, 5 year repeat   Allergy    Diverticulosis    HTN (hypertension)    Hyperlipidemia    no rx. total just above 200, HDL great, LDL in 120s 130s   Internal hemorrhoids    Leukopenia    stable in the 3s   Migraine    relpax about once a month    Past Surgical History:  Procedure Laterality Date   ANAL FISSURE REPAIR  2007   COLONOSCOPY W/ POLYPECTOMY     HERNIA REPAIR     as infant; bilateral   right elbow surgery     VASECTOMY      Outpatient Medications Prior to Visit  Medication Sig Dispense Refill   amLODipine (NORVASC) 10 MG tablet Take 1 tablet (10 mg  total) by mouth daily. 90 tablet 3   fexofenadine (ALLEGRA) 180 MG tablet Take 180 mg by mouth daily.     rosuvastatin (CRESTOR) 10 MG tablet Take 1 tablet (10 mg total) by mouth daily. 90 tablet 3   valsartan (DIOVAN) 80 MG tablet Take 1 tablet (80 mg total) by mouth daily. 30 tablet 5   fluticasone (FLOVENT HFA) 110 MCG/ACT inhaler Inhale 2 puffs into the lungs in the morning and at bedtime. (Patient not taking: No sig reported) 1 each 1   No facility-administered medications prior to visit.    No Known Allergies  Family History  Problem Relation Age of Onset   Lung cancer Mother        smoker, died 42 age 43   Heart failure Father        rheumatic fever-died 23 months after mother, did not take care of himself   Colon cancer Maternal Grandfather        early 75s   Stomach cancer Neg Hx     Social History   Tobacco Use   Smoking status: Never   Smokeless tobacco: Never  Substance Use Topics   Alcohol use: Yes    Alcohol/week: 7.0 standard drinks  Types: 7 Standard drinks or equivalent per week   Drug use: No    ROS: As per history of present illness, otherwise negative  BP 100/68   Pulse (!) 53   Ht 5\' 10"  (1.778 m)   Wt 172 lb (78 kg)   BMI 24.68 kg/m  Constitutional: Well-developed and well-nourished. No distress. HEENT: Normocephalic and atraumatic. Oropharynx is clear and moist.  No scleral icterus. Neck: Normal no adenopathy, supple Cardiovascular: Normal rate, regular rhythm and intact distal pulses. No M/R/G Pulmonary/chest: Effort normal and breath sounds normal. No wheezing, rales or rhonchi. Abdominal: Soft, nontender, nondistended. Bowel sounds active throughout. There are no masses palpable. No hepatosplenomegaly. Extremities: no clubbing, cyanosis, or edema Neurological: Alert and oriented to person place and time. Skin: Skin is warm and dry.  Psychiatric: Normal mood and affect. Behavior is normal.  RELEVANT LABS AND IMAGING: CBC     Component Value Date/Time   WBC 3.9 06/25/2020 0900   RBC 4.70 06/25/2020 0900   HGB 14.7 06/25/2020 0900   HCT 43.6 06/25/2020 0900   PLT 181 06/25/2020 0900   MCV 92.8 06/25/2020 0900   MCH 31.3 06/25/2020 0900   MCHC 33.7 06/25/2020 0900   RDW 11.8 06/25/2020 0900   LYMPHSABS 683 (L) 06/25/2020 0900   MONOABS 0.4 12/28/2019 0955   EOSABS 70 06/25/2020 0900   BASOSABS 31 06/25/2020 0900    CMP     Component Value Date/Time   NA 138 09/04/2020 0940   K 4.3 09/04/2020 0940   CL 105 09/04/2020 0940   CO2 30 09/04/2020 0940   GLUCOSE 88 09/04/2020 0940   BUN 15 09/04/2020 0940   CREATININE 1.13 09/04/2020 0940   CREATININE 0.90 06/25/2020 0900   CALCIUM 9.3 09/04/2020 0940   PROT 6.7 06/25/2020 0900   ALBUMIN 4.5 12/28/2019 0955   AST 19 06/25/2020 0900   ALT 13 06/25/2020 0900   ALKPHOS 56 12/28/2019 0955   BILITOT 0.6 06/25/2020 0900   GFRNONAA 94 06/25/2020 0900   GFRAA 109 06/25/2020 0900    ASSESSMENT/PLAN: 60 year old male with a history of nonadvanced adenomatous colon polyp, diverticulosis, hypertension, hyperlipidemia and migraines who is seen in consult at the request of Dr. Yong Channel to evaluate reflux and LPR type symptoms.    GERD/LPR --we discussed his symptoms today which are highly suspicious for acid reflux disease and laryngopharyngeal reflux (occasional heartburn but more prevalent globus sensation, throat clearing and cough).  We discussed how we would treat this starting with a PPI trial and consideration of upper endoscopy.  We also discussed that should he not be PPI responsive we could consider pH testing with Bravo capsule.  We will proceed as follows: --Pantoprazole 40 mg once daily; best taken 30 minutes before first meal of the day --I asked that he call me or send me a MyChart message around 03/02/2021 to let me know if his symptoms have improved --Consider eventual upper endoscopy  2.  CRC screening --nonadvanced adenomatous colon polyp in 2013;  surveillance colonoscopy November 2018 normal with the exception of hemorrhoids and diverticulosis. --Surveillance colonoscopy recommended November 2028      HU:[id number], Brayton Mars, [address],  [place] [id number]

## 2021-02-03 ENCOUNTER — Other Ambulatory Visit: Payer: Self-pay | Admitting: Family Medicine

## 2021-02-16 ENCOUNTER — Other Ambulatory Visit: Payer: Self-pay | Admitting: Internal Medicine

## 2021-03-02 NOTE — Telephone Encounter (Signed)
Please let patient know I appreciate his email and am very glad to hear he is better Yes, he can continue with the pantoprazole 40 mg daily (#90, 1 refill) Office followup annually if not needed before Given improvement we can hold on EGD for now Thanks JMP

## 2021-03-02 NOTE — Progress Notes (Signed)
Phone [phone] In person visit   Subjective:   Matthew Fox is a 60 y.o. year old very pleasant male patient who presents for/with See problem oriented charting Chief Complaint  Patient presents with   Hypertension   Hyperlipidemia   Gastroesophageal Reflux   This visit occurred during the SARS-CoV-2 public health emergency.  Safety protocols were in place, including screening questions prior to the visit, additional usage of staff PPE, and extensive cleaning of exam room while observing appropriate contact time as indicated for disinfecting solutions.   Past Medical History-  Patient Active Problem List   Diagnosis Date Noted   Thoracic aortic aneurysm ([place]) 09/04/2020    Priority: High   BPPV (benign paroxysmal positional vertigo), left 12/28/2019    Priority: Medium   Essential hypertension 08/28/2019    Priority: Medium   Allergic rhinitis 06/16/2017    Priority: Medium   Migraine     Priority: Medium   Hyperlipidemia     Priority: Medium   Adenomatous colon polyp     Priority: Low   Leukopenia     Priority: Low   Medial epicondylitis of elbow, right 07/16/2019    Medications- reviewed and updated Current Outpatient Medications  Medication Sig Dispense Refill   amLODipine (NORVASC) 10 MG tablet Take 1 tablet (10 mg total) by mouth daily. 90 tablet 3   fexofenadine (ALLEGRA) 180 MG tablet Take 180 mg by mouth daily.     LORazepam (ATIVAN) 0.5 MG tablet Take 1 tablet (0.5 mg total) by mouth daily as needed for anxiety (prior to imaging- 30 minutes ahead of time. do not drive for 8 hours after use.). 5 tablet 0   Omega-3 Fatty Acids (FISH OIL) 1200 MG CAPS Take by mouth.     pantoprazole (PROTONIX) 40 MG tablet Take 1 tablet (40 mg total) by mouth daily. 30 tablet 1   rosuvastatin (CRESTOR) 10 MG tablet Take 1 tablet (10 mg total) by mouth daily. (Patient taking differently: Take 10 mg by mouth every other day.) 90 tablet 3   valsartan (DIOVAN) 80 MG tablet  TAKE 1 TABLET(80 MG) BY MOUTH DAILY. 30 tablet 5   No current facility-administered medications for this visit.     Objective:  BP 106/72   Pulse (!) 58   Temp 97.9 F (36.6 C) (Temporal)   Ht '5\' 10"'$  (1.778 m)   Wt 173 lb (78.5 kg)   SpO2 95%   BMI 24.82 kg/m  Gen: NAD, resting comfortably CV: RRR no murmurs rubs or gallops Lungs: CTAB no crackles, wheeze, rhonchi Abdomen: soft/nontender/nondistended/normal bowel sounds.  Ext: no edema Skin: warm, dry     Assessment and Plan   # GERD unspecified whether esophagitis - followed up by Dr.Pyrtle S: Medication:pantoprazole 40 mg daily  Patient was referred to GI for their expert opinion on possible reflux symptoms -saw Dr. Hilarie Fredrickson on January 23, 2021- prevalent globus sensation, throat clearing and cough and started on treatment with plan to consider endoscopy if fails to improve  Today, patient reports at least 50% improvement- wants to hold off on EGD. Dr. Hilarie Fredrickson wrote back on 02/28/21 and plans for long term PPI for now with recheck annually.   Other GI notes:  -CRC screening --nonadvanced adenomatous colon polyp in 2013; surveillance colonoscopy November 2018 normal with the exception of hemorrhoids and diverticulosis. --Surveillance colonoscopy recommended November 2028 and considered eventual upper endoscopy A/P: significant improvement- continue PPI and Gi follow up   - we discussed risk for low  b12 with long term PPI use and option of once weekly b12 1000 mcg - he takes a MV and he will make sure this has at least 100% of vitamin b12- this should be fine- still recheck net labs  # aortic aneurysm thoracic incidental S: Incidental finding on imaging for coronary calcium scoring at 42 mm in January 2022. - At last visit, we discussed advanced imaging with 6 month CT angiogram repeat planned.  -Systolic blood pressure goal 105-120  A/P: we ordered updated CT angiogram aorta today. BP at goal but may loosen- occasional  lightheadedness with position change- though sounds more like his vertigo.  -possible vascular consult pending results   #hypertension-more aggressive blood pressure goal due to potential aneurysm S: medication: amlodipine 10 mg daily , valsartan '80mg'$  daily Home readings #s: checking less recently. On home readingsbefore move started 3 weeks ago- running around 110  BP Readings from Last 3 Encounters:  03/04/21 106/72  01/23/21 100/68  09/04/20 104/76  A/P: blood pressure running lower here and at home- he is going to check in with me in 2 weeks when updates me on cholesterol medicine- if persistently around 105-110 could consider doing half dose of valsartan as long as home readings stay under 120 - BP at goal but may loosen- occasional lightheadedness with position change- though sounds more like his vertigo.   #hyperlipidemia with coronary calcium score of 68 but focus of calcium in proximal LAD.  LDL before starting medication 136 S: Medication:Rosuvastatin 10 mg daily . Some exercise intolerance - pushed through previously. Some myalgias- taking fish oil daily and opted to try medicine every other day-  Lab Results  Component Value Date   CHOL 223 (H) 06/25/2020   HDL 73 06/25/2020   LDLCALC 136 (H) 06/25/2020   LDLDIRECT 132.8 05/23/2013   TRIG 46 06/25/2020   CHOLHDL 3.1 06/25/2020   A/P:  likely improved control BUT significant potential side effects. From avs "Due to myalgias and arthralgias- lets take 2 weeks off of rosuvastatin completely and then message me with update - hoping for full resolution. Likely restart at every other day. Continue coq10 as well"  # patient with covid in December-in addition to 3 COVID-19 vaccinations through end of 2021-he plans to wait until new COVID specific variant is available  #BPPV starting 2021- referred to vestibular rehab in January 2022- sparing issues- does better when sees rehab compared to home exercises- has had intermittnt issues  lately- vs also wonder about orthostatic intolerance with BP  Health Maintenance Due  Topic Date Due   COVID-19 Vaccine (4 - Booster for [employer] series) Patient will send a Pharmacist, community message with his dates.  09/19/2020   INFLUENZA VACCINE Not available in office yet.  02/16/2021   Recommended follow up: keep December visit Future Appointments  Date Time Provider [place]  06/30/2021  8:00 AM Marin Olp, MD [facility]   Lab/Order associations: not fasting   ICD-10-CM   1. Essential hypertension  I10     2. Hyperlipidemia, unspecified hyperlipidemia type  E78.5     3. Thoracic aortic aneurysm without rupture (HCC)  I71.2 CT ANGIO CHEST AORTA W/CM & OR WO/CM    CANCELED: CT ANGIO CHEST AORTA W/CM & OR WO/CM    4. Gastroesophageal reflux disease, unspecified whether esophagitis present  K21.9       Meds ordered this encounter  Medications   LORazepam (ATIVAN) 0.5 MG tablet    Sig: Take 1 tablet (0.5 mg total)  by mouth daily as needed for anxiety (prior to imaging- 30 minutes ahead of time. do not drive for 8 hours after use.).    Dispense:  5 tablet    Refill:  0   I,Jada Bradford,acting as a scribe for Garret Reddish, MD.,have documented all relevant documentation on the behalf of Garret Reddish, MD,as directed by  Garret Reddish, MD while in the presence of Garret Reddish, MD.  I, Garret Reddish, MD, have reviewed all documentation for this visit. The documentation on 03/04/21 for the exam, diagnosis, procedures, and orders are all accurate and complete.   Return precautions advised.  Garret Reddish, MD

## 2021-03-04 ENCOUNTER — Ambulatory Visit (INDEPENDENT_AMBULATORY_CARE_PROVIDER_SITE_OTHER): Payer: BC Managed Care – PPO | Admitting: Family Medicine

## 2021-03-04 ENCOUNTER — Encounter: Payer: Self-pay | Admitting: Family Medicine

## 2021-03-04 ENCOUNTER — Other Ambulatory Visit: Payer: Self-pay

## 2021-03-04 VITALS — BP 106/72 | HR 58 | Temp 97.9°F | Ht 70.0 in | Wt 173.0 lb

## 2021-03-04 DIAGNOSIS — I10 Essential (primary) hypertension: Secondary | ICD-10-CM

## 2021-03-04 DIAGNOSIS — K219 Gastro-esophageal reflux disease without esophagitis: Secondary | ICD-10-CM

## 2021-03-04 DIAGNOSIS — E785 Hyperlipidemia, unspecified: Secondary | ICD-10-CM | POA: Diagnosis not present

## 2021-03-04 DIAGNOSIS — I712 Thoracic aortic aneurysm, without rupture, unspecified: Secondary | ICD-10-CM

## 2021-03-04 LAB — CBC WITH DIFFERENTIAL/PLATELET
Basophils Absolute: 0 10*3/uL (ref 0.0–0.1)
Basophils Relative: 1.1 % (ref 0.0–3.0)
Eosinophils Absolute: 0.1 10*3/uL (ref 0.0–0.7)
Eosinophils Relative: 2.3 % (ref 0.0–5.0)
HCT: 40.2 % (ref 39.0–52.0)
Hemoglobin: 13.5 g/dL (ref 13.0–17.0)
Lymphocytes Relative: 20.4 % (ref 12.0–46.0)
Lymphs Abs: 0.7 10*3/uL (ref 0.7–4.0)
MCHC: 33.5 g/dL (ref 30.0–36.0)
MCV: 93.3 fl (ref 78.0–100.0)
Monocytes Absolute: 0.3 10*3/uL (ref 0.1–1.0)
Monocytes Relative: 9.7 % (ref 3.0–12.0)
Neutro Abs: 2.3 10*3/uL (ref 1.4–7.7)
Neutrophils Relative %: 66.5 % (ref 43.0–77.0)
Platelets: 165 10*3/uL (ref 150.0–400.0)
RBC: 4.31 Mil/uL (ref 4.22–5.81)
RDW: 13.3 % (ref 11.5–15.5)
WBC: 3.4 10*3/uL — ABNORMAL LOW (ref 4.0–10.5)

## 2021-03-04 LAB — COMPREHENSIVE METABOLIC PANEL
ALT: 23 U/L (ref 0–53)
AST: 27 U/L (ref 0–37)
Albumin: 4.3 g/dL (ref 3.5–5.2)
Alkaline Phosphatase: 45 U/L (ref 39–117)
BUN: 15 mg/dL (ref 6–23)
CO2: 29 mEq/L (ref 19–32)
Calcium: 9.4 mg/dL (ref 8.4–10.5)
Chloride: 103 mEq/L (ref 96–112)
Creatinine, Ser: 1.16 mg/dL (ref 0.40–1.50)
GFR: 69.04 mL/min (ref >60.00)
Glucose, Bld: 90 mg/dL (ref 70–99)
Potassium: 4.5 mEq/L (ref 3.5–5.1)
Sodium: 138 mEq/L (ref 135–145)
Total Bilirubin: 0.7 mg/dL (ref 0.2–1.2)
Total Protein: 7.1 g/dL (ref 6.0–8.3)

## 2021-03-04 MED ORDER — LORAZEPAM 0.5 MG PO TABS: 0.5000 mg | ORAL_TABLET | Freq: Every day | ORAL | 0 refills | Status: DC | PRN

## 2021-03-04 NOTE — Patient Instructions (Addendum)
Health Maintenance Due  Topic Date Due   COVID-19 Vaccine (4 - Booster for [employer] series) Patient will send a Pharmacist, community message with his dates.  09/19/2020   INFLUENZA VACCINE Not available in office yet. Let us know if you get outside of our office 02/16/2021   Due to myalgias and arthralgias- lets take 2 weeks off of rosuvastatin completely and then message me with update - hoping for full resolution. Likely restart at every other day. Continue coq10 as well  blood pressure running lower here and at home- he is going to check in with me in 2 weeks when updates me on cholesterol medicine- if persistently around 105-110 could consider doing half dose of valsartan as long as home readings stay under 120  We will call you within two weeks about your referral for CT angiogram of aorta to further evaluate. If you do not hear within 2 weeks, give [place] a call.   Recommended follow up: keep December visit

## 2021-03-19 ENCOUNTER — Encounter: Payer: Self-pay | Admitting: Family Medicine

## 2021-03-19 DIAGNOSIS — N281 Cyst of kidney, acquired: Secondary | ICD-10-CM

## 2021-03-21 MED ORDER — PANTOPRAZOLE SODIUM 40 MG PO TBEC: 40.0000 mg | DELAYED_RELEASE_TABLET | Freq: Every day | ORAL | 1 refills | Status: DC

## 2021-03-27 ENCOUNTER — Ambulatory Visit
Admission: RE | Admit: 2021-03-27 | Discharge: 2021-03-27 | Disposition: A | Discharge status: Home / Self Care | Payer: BC Managed Care – PPO | Source: Ambulatory Visit | Attending: Family Medicine | Admitting: Family Medicine

## 2021-03-27 DIAGNOSIS — I2510 Atherosclerotic heart disease of native coronary artery without angina pectoris: Secondary | ICD-10-CM | POA: Diagnosis not present

## 2021-03-27 DIAGNOSIS — I712 Thoracic aortic aneurysm, without rupture, unspecified: Secondary | ICD-10-CM

## 2021-03-27 DIAGNOSIS — Q8909 Congenital malformations of spleen: Secondary | ICD-10-CM | POA: Diagnosis not present

## 2021-03-27 DIAGNOSIS — I700 Atherosclerosis of aorta: Secondary | ICD-10-CM | POA: Diagnosis not present

## 2021-03-27 MED ORDER — IOPAMIDOL (ISOVUE-370) INJECTION 76%
75.0000 mL | Freq: Once | INTRAVENOUS | Status: AC | PRN
  Administered 2021-03-27: 75 mL via INTRAVENOUS

## 2021-03-27 MED ORDER — IOPAMIDOL (ISOVUE-370) INJECTION 76%: 75.0000 mL | Freq: Once | INTRAVENOUS | Status: DC | PRN

## 2021-03-29 ENCOUNTER — Encounter: Payer: Self-pay | Admitting: Family Medicine

## 2021-03-29 DIAGNOSIS — I700 Atherosclerosis of aorta: Secondary | ICD-10-CM | POA: Insufficient documentation

## 2021-04-01 ENCOUNTER — Other Ambulatory Visit: Payer: Self-pay

## 2021-04-01 DIAGNOSIS — L821 Other seborrheic keratosis: Secondary | ICD-10-CM | POA: Diagnosis not present

## 2021-04-01 DIAGNOSIS — D2371 Other benign neoplasm of skin of right lower limb, including hip: Secondary | ICD-10-CM | POA: Diagnosis not present

## 2021-04-01 DIAGNOSIS — L570 Actinic keratosis: Secondary | ICD-10-CM | POA: Diagnosis not present

## 2021-04-01 DIAGNOSIS — I712 Thoracic aortic aneurysm, without rupture, unspecified: Secondary | ICD-10-CM

## 2021-04-01 DIAGNOSIS — L578 Other skin changes due to chronic exposure to nonionizing radiation: Secondary | ICD-10-CM | POA: Diagnosis not present

## 2021-04-01 DIAGNOSIS — D225 Melanocytic nevi of trunk: Secondary | ICD-10-CM | POA: Diagnosis not present

## 2021-04-09 ENCOUNTER — Other Ambulatory Visit: Payer: Self-pay

## 2021-04-09 ENCOUNTER — Ambulatory Visit
Admission: RE | Admit: 2021-04-09 | Discharge: 2021-04-09 | Disposition: A | Discharge status: Home / Self Care | Payer: BC Managed Care – PPO | Source: Ambulatory Visit | Attending: Family Medicine | Admitting: Family Medicine

## 2021-04-09 DIAGNOSIS — N281 Cyst of kidney, acquired: Secondary | ICD-10-CM

## 2021-04-09 DIAGNOSIS — N1330 Unspecified hydronephrosis: Secondary | ICD-10-CM | POA: Diagnosis not present

## 2021-04-09 MED ORDER — GADOBENATE DIMEGLUMINE 529 MG/ML IV SOLN
15.0000 mL | Freq: Once | INTRAVENOUS | Status: AC | PRN
  Administered 2021-04-09: 15 mL via INTRAVENOUS

## 2021-04-13 ENCOUNTER — Encounter: Payer: Self-pay | Admitting: Family Medicine

## 2021-04-30 ENCOUNTER — Other Ambulatory Visit: Payer: Self-pay

## 2021-04-30 DIAGNOSIS — N1330 Unspecified hydronephrosis: Secondary | ICD-10-CM

## 2021-04-30 NOTE — Telephone Encounter (Signed)
Referral to urology has been sent as urgent.

## 2021-05-07 ENCOUNTER — Other Ambulatory Visit: Payer: Self-pay

## 2021-05-07 ENCOUNTER — Institutional Professional Consult (permissible substitution) (INDEPENDENT_AMBULATORY_CARE_PROVIDER_SITE_OTHER): Payer: BC Managed Care – PPO | Admitting: Physician Assistant

## 2021-05-07 VITALS — BP 111/68 | HR 77 | Resp 20 | Ht 70.0 in | Wt 173.0 lb

## 2021-05-07 DIAGNOSIS — I7121 Aneurysm of the ascending aorta, without rupture: Secondary | ICD-10-CM

## 2021-05-07 NOTE — Progress Notes (Signed)
[address]       ,[place] [address]             [phone]        Josephus P Gavina [place] Medical Record #[id number] Date of Birth: [birth year]  Referring: Marin Olp, MD Primary Care: Marin Olp, MD Primary Cardiologist:None  Chief Complaint:   Ascending thoracic aortic aneurysm  History of Present Illness:      The patient is a 60 year old male with past medical history significant for hypertension,  hyperlipidemia who was referred to our office for a ascending aortic aneurysm which was a incidental finding on imaging for coronary calcium scoring at 42 mm in January 2022.  The primary care provider then decided to order a CTA in 6 months for aneurysmal surveillance.  On follow-up CTA he was found to have a stable 4.0 cm  fusiform ascending aortic aneurysm.  Dr. Yong Channel has been following his blood pressure over the years and it has been stable.  He has been very active.  He is an avid runner running about 600 miles a year.  He also does the occasional 5K races.  He watches his diet and has been monitoring his blood pressure at home.  He does not have any genetic connective tissue disorders in his family that he knows of.  He mentions that his father had open heart surgery twice and he would never want to go through anything like that.  On his mother side there is history of lung cancer.  He is a non-smoker.  He has been working with Dr. Yong Channel to try and mediate some of the side effects of the statin medication.  He is only taking his Crestor twice a week because he says it causes a lot of muscle fatigue in his lower legs.  He is interested in seeing a cardiologist for other cholesterol medications that might have less side effects.  A referral has been made today.   Current Activity/ Functional Status: Patient is independent with mobility/ambulation, transfers, ADL's, IADL's.   Zubrod Score: At the time of surgery this patient's most  appropriate activity status/level should be described as: [x]     0    Normal activity, no symptoms []     1    Restricted in physical strenuous activity but ambulatory, able to do out light work []     2    Ambulatory and capable of self care, unable to do work activities, up and about                 more than 50%  Of the time                            []     3    Only limited self care, in bed greater than 50% of waking hours []     4    Completely disabled, no self care, confined to bed or chair []     5    Moribund  Past Medical History:  Diagnosis Date   Adenomatous colon polyp    2013, 5 year repeat   Allergy    Diverticulosis    HTN (hypertension)    Hyperlipidemia    no rx. total just above 200, HDL great, LDL in 120s 130s   Internal hemorrhoids    Leukopenia    stable in the 3s   Migraine    relpax  about once a month    Past Surgical History:  Procedure Laterality Date   ANAL FISSURE REPAIR  2007   COLONOSCOPY W/ POLYPECTOMY     HERNIA REPAIR     as infant; bilateral   right elbow surgery     VASECTOMY      Social History   Tobacco Use  Smoking Status Never  Smokeless Tobacco Never    Social History   Substance and Sexual Activity  Alcohol Use Yes   Alcohol/week: 7.0 standard drinks   Types: 7 Standard drinks or equivalent per week     No Known Allergies  Current Outpatient Medications  Medication Sig Dispense Refill   amLODipine (NORVASC) 10 MG tablet Take 1 tablet (10 mg total) by mouth daily. 90 tablet 3   fexofenadine (ALLEGRA) 180 MG tablet Take 180 mg by mouth daily.     LORazepam (ATIVAN) 0.5 MG tablet Take 1 tablet (0.5 mg total) by mouth daily as needed for anxiety (prior to imaging- 30 minutes ahead of time. do not drive for 8 hours after use.). 5 tablet 0   Omega-3 Fatty Acids (FISH OIL) 1200 MG CAPS Take by mouth.     pantoprazole (PROTONIX) 40 MG tablet Take 1 tablet (40 mg total) by mouth daily. 90 tablet 1   rosuvastatin (CRESTOR) 10 MG  tablet Take 1 tablet (10 mg total) by mouth daily. (Patient taking differently: Take 10 mg by mouth every other day.) 90 tablet 3   valsartan (DIOVAN) 80 MG tablet TAKE 1 TABLET(80 MG) BY MOUTH DAILY. 30 tablet 5   No current facility-administered medications for this visit.    (Not in a hospital admission)   Family History  Problem Relation Age of Onset   Lung cancer Mother        smoker, died 38 age 66   Heart failure Father        rheumatic fever-died 23 months after mother, did not take care of himself   Colon cancer Maternal Grandfather        early 21s   Stomach cancer Neg Hx      Review of Systems:   ROS Pertinent items are noted in HPI.      Physical Exam: There were no vitals taken for this visit. Vitals:   05/07/21 1335  BP: 111/68  Pulse: 77  Resp: 20  SpO2: 98%      General appearance: alert, cooperative, and no distress Resp: clear to auscultation bilaterally Cardio: regular rate and rhythm, S1, S2 normal, no murmur, click, rub or gallop GI: non-tender Extremities: no edema Neurologic: Grossly normal  Diagnostic Studies & Laboratory data:  Narrative & Impression  CLINICAL DATA:  Follow-up thoracic aortic aneurysm.   EXAM: CT ANGIOGRAPHY CHEST WITH CONTRAST   TECHNIQUE: Multidetector CT imaging of the chest was performed using the standard protocol during bolus administration of intravenous contrast. Multiplanar CT image reconstructions and MIPs were obtained to evaluate the vascular anatomy.   CONTRAST:  43mL ISOVUE-370 IOPAMIDOL (ISOVUE-370) INJECTION 76%   COMPARISON:  Cardiac CT-08/14/2020   FINDINGS: Vascular Findings:   Mild fusiform aneurysmal dilatation of the ascending thoracic aorta with measurements as follows. The thoracic aorta tapers to a normal caliber at the level of the aortic arch. Conventional configuration of the aortic arch. The branch vessels of the aortic arch appear widely patent throughout their imaged  courses. The descending thoracic aorta is of normal caliber and widely patent without hemodynamically significant narrowing. No evidence of thoracic aortic dissection  or periaortic stranding on this nongated examination.   Normal heart size. Coronary artery calcifications. No pericardial effusion.   Although this examination was not tailored for the evaluation the pulmonary arteries, there are no discrete filling defects within the central pulmonary arterial tree to suggest central pulmonary embolism. Normal caliber of the main pulmonary artery.   -------------------------------------------------------------   Thoracic aortic measurements:   SINOTUBULAR JUNCTION: 35 mm as measured in greatest oblique short axis coronal dimension.   PROXIMAL ASCENDING THORACIC AORTA: 40 mm as measured in greatest oblique short axis axial dimension at the level of the main pulmonary artery (axial image 51, series 6) and approximately 40 mm in greatest oblique short axis coronal diameter (coronal image 50, series 10)   AORTIC ARCH: 30 mm as measured in greatest oblique short axis sagittal dimension.   PROXIMAL DESCENDING THORACIC AORTA: 26 mm as measured in greatest oblique short axis axial dimension at the level of the main pulmonary artery.   DISTAL DESCENDING THORACIC AORTA: 25 mm as measured in greatest oblique short axis axial dimension at the level of the diaphragmatic hiatus.   Review of the MIP images confirms the above findings.   -------------------------------------------------------------   Non-Vascular Findings:   Mediastinum/Lymph Nodes: No bulky mediastinal, hilar or axillary lymphadenopathy.   Lungs/Pleura: No focal airspace opacities. No pleural effusion or pneumothorax. The central pulmonary airways appear widely patent. No discrete pulmonary nodules.   Upper abdomen: Limited early arterial phase evaluation of the upper abdomen demonstrates a large (at least 6.7 cm)  left-sided parapelvic cyst versus extrarenal pelvis (image 115, series 6), incompletely imaged.   There is an approximately 1.9 x 1.6 x 1.4 cm potentially enhancing nodule arising from the anterior superior pole the right kidney (axial image 99, series 6; coronal image 106, series 10.   There is an approximately 0.9 cm hypoattenuating nodule within mid aspect of the right kidney which is incompletely imaged though may represent a renal cyst. Note is made of a small splenule about the posterior aspect of the spleen.   Musculoskeletal: No acute or aggressive osseous abnormalities. Mild to moderate DDD of C6-C7 with disc space height loss, endplate irregularity and small posteriorly directed disc osteophyte complex.   IMPRESSION: 1. Stable uncomplicated mild fusiform aneurysmal dilatation of the ascending thoracic aorta measuring 40 mm in diameter, unchanged compared to the 07/2020 examination. Recommend annual imaging followup by CTA or MRA. This recommendation follows 2010 ACCF/AHA/AATS/ACR/ASA/SCA/SCAI/SIR/STS/SVM Guidelines for the Diagnosis and Management of Patients with Thoracic Aortic Disease. Circulation. 2010; 121: I458-K998. Aortic aneurysm NOS (ICD10-I71.9) 2. Indeterminate approximately 1.9 cm potentially enhancing nodule arising from the anterior superior aspect of the right kidney, incompletely evaluated on the present examination and while potentially representative of a minimally complex cyst, nonenhancing renal lesions such as a renal cell carcinoma is not excluded on the basis of this examination. Further evaluation with abdominal MRI (preferably) or renal protocol CT could be performed as indicated. 3. At least 6.7 cm left-sided parapelvic cyst versus extrarenal pelvis, incompletely imaged though may be further evaluated on above recommended cross-sectional imaging. 4. Coronary calcifications.  Aortic Atherosclerosis (ICD10-I70.0).     Electronically Signed    By: Sandi Mariscal M.D.   On: 03/27/2021 15:32       Recent Radiology Findings:   No results found.   I have independently reviewed the above radiologic studies and discussed with the patient   Recent Lab Findings: Lab Results  Component Value Date   WBC 3.4 (L) 03/04/2021  HGB 13.5 03/04/2021   HCT 40.2 03/04/2021   PLT 165.0 03/04/2021   GLUCOSE 90 03/04/2021   CHOL 223 (H) 06/25/2020   TRIG 46 06/25/2020   HDL 73 06/25/2020   LDLDIRECT 132.8 05/23/2013   LDLCALC 136 (H) 06/25/2020   ALT 23 03/04/2021   AST 27 03/04/2021   NA 138 03/04/2021   K 4.5 03/04/2021   CL 103 03/04/2021   CREATININE 1.16 03/04/2021   BUN 15 03/04/2021   CO2 29 03/04/2021   TSH 2.60 12/28/2019      Assessment / Plan:      Mr. Zurn is a 60 year old male patient with a past medical history significant for hypertension and hyperlipidemia.  He has been followed by Dr. Yong Channel for years.  He is very active and runs almost every day.  He has not had any chest pain or shortness of breath.  Ascending thoracic aortic aneurysm-measuring 4.0 cm on CTA.  This has been stable.  We reviewed the importance of tight blood pressure control.  The patient agrees to take his medication and to monitor his blood pressure at home. Hyperlipidemia-he is having some side effects with his statin medication.  He states that it makes his legs feel tired like he has already worked out for the day.  There are other lipid-lowering options available and I have referred him to [facility] cardiology to review those options with him. Hypertension-well-controlled at this time He has an appointment with urology to work-up his right kidney nodule.   Plan: We will plan to see Mr. Partin back in 1 year with a CTA to monitor his ascending thoracic aortic aneurysm.  Referral made to [facility] cardiology for hyperlipidemia.  I  spent 30 minutes counseling the patient face to face.   Nicholes Rough, PA-C 05/07/2021 1:31 PM

## 2021-05-31 ENCOUNTER — Other Ambulatory Visit: Payer: Self-pay | Admitting: Family Medicine

## 2021-06-09 NOTE — Progress Notes (Signed)
Cardiology Office Note:   Date:  06/10/2021  NAME:  Matthew Fox    MRN: [id number] DOB:  01-15-1961   PCP:  Matthew Olp, MD  Cardiologist:  None  Electrophysiologist:  None   Referring MD: Matthew Collard, PA-C   Chief Complaint  Patient presents with   Hyperlipidemia   History of Present Illness:   Matthew Fox is a 60 y.o. male with a hx of aortic dilation, coronary calcium, HLD who is being seen today for the evaluation of HLD at the request of Matthew Olp, MD. he was diagnosed with mildly elevated coronary calcium.  Value 68 which is 52nd percentile.  He was started on statin therapy but cannot tolerate it.  He is only tried Crestor.  He is now on Crestor 10 mg on Thursdays and Sundays.  He has never tried Lipitor.  He denies any chest pain or trouble breathing.  His blood pressure is a bit low 102/53.  Given his recent diagnosis of aortic dilation blood pressure goal has changed per his primary care physician.  He is on valsartan 40 mg daily as well as amlodipine 10 mg daily.  No syncope.  No dizziness or lightheadedness.  His EKG in office is extremely normal.  He is quite active and fairly healthy.  He reports he exercises 3 to 4 days/week.  This includes running 2 to 4 miles.  No chest pain or trouble breathing with this.  He reports his diet could be better but likely seems to be within limits.  Body weight is normal.  He works as a Music therapist in town.  Works for [employer].  He has 2 daughters.  One of his daughters is a Designer, jewellery.  He is married as well.  No recent echocardiogram.  We discussed that his ascending aorta is likely at the upper limits of normal given his age and high level of activity.  We do need to check an echocardiogram to make sure he has normal heart valves.  He does report that his father had mitral valve surgery in the past.  He does not have a mitral valve problem.  He has no murmurs on examination either.   Cardiovascular examination is very unremarkable.  No family history of obstructive coronary artery disease.  No history of aortic dissection in the family.  No history of sudden death.   Problem List Coronary calcium -CAC score 68 (52nd percentile) 2. Ascending aortic dilation  -03/2021 40 mm  3. HLD -T chol 223, HDL 73, LDL 136, TG 46  Past Medical History: Past Medical History:  Diagnosis Date   Adenomatous colon polyp    2013, 5 year repeat   Allergy    Coronary artery disease    Diverticulosis    HTN (hypertension)    Hyperlipidemia    no rx. total just above 200, HDL great, LDL in 120s 130s   Internal hemorrhoids    Leukopenia    stable in the 3s   Migraine    relpax about once a month    Past Surgical History: Past Surgical History:  Procedure Laterality Date   ANAL FISSURE REPAIR  2007   COLONOSCOPY W/ POLYPECTOMY     HERNIA REPAIR     as infant; bilateral   right elbow surgery     VASECTOMY      Current Medications: Current Meds  Medication Sig   fexofenadine (ALLEGRA) 180 MG tablet Take 180 mg by mouth  daily.   Omega-3 Fatty Acids (FISH OIL) 1200 MG CAPS Take by mouth.   pantoprazole (PROTONIX) 40 MG tablet Take 1 tablet (40 mg total) by mouth daily.   rosuvastatin (CRESTOR) 10 MG tablet Take 1 tablet (10 mg total) by mouth daily. (Patient taking differently: Take 10 mg by mouth every other day. Take 10mg  twice week (Sunday and Thursday))   valsartan (DIOVAN) 80 MG tablet TAKE 1 TABLET(80 MG) BY MOUTH DAILY. (Patient taking differently: Take 40 mg by mouth daily.)   [DISCONTINUED] amLODipine (NORVASC) 10 MG tablet TAKE 1 TABLET(10 MG) BY MOUTH DAILY     Allergies:    Patient has no known allergies.   Social History: Social History   Socioeconomic History   Marital status: Married    Spouse name: Not on file   Number of children: 2   Years of education: Not on file   Highest education level: Not on file  Occupational History   Occupation:  Facilities manager  Tobacco Use   Smoking status: Never   Smokeless tobacco: Never  Substance and Sexual Activity   Alcohol use: Yes    Alcohol/week: 7.0 standard drinks    Types: 7 Standard drinks or equivalent per week   Drug use: No   Sexual activity: Not on file  Other Topics Concern   Not on file  Social History Narrative   Married (wife patient of Dr. Yong Channel), 2 daughters Janett Billow NP (in [place] with palliative care, had been in [place] with oncology) and Lilia Pro  ([employer] grad going into insurance in [place] 2019)       Since 2016- transferred to [employer]- opened new branch and brought his team with him.    Prior Worked at [employer] handed,three story house   Hobbies: running, biking, golf   Social Determinants of Health   Financial Resource Strain: Not on file  Food Insecurity: Not on file  Transportation Needs: Not on file  Physical Activity: Not on file  Stress: Not on file  Social Connections: Not on file     Family History: The patient's family history includes Colon cancer in his maternal grandfather; Heart failure in his father; Lung cancer in his mother; Mitral valve prolapse in his father. There is no history of Stomach cancer.  ROS:   All other ROS reviewed and negative. Pertinent positives noted in the HPI.     EKGs/Labs/Other Studies Reviewed:   The following studies were personally reviewed by me today:  EKG:  EKG is ordered today.  The ekg ordered today demonstrates normal sinus rhythm heart rate 65, no acute ischemic changes or evidence of infarction, and was personally reviewed by me.   CTA Chest 03/27/2021 1. Stable uncomplicated mild fusiform aneurysmal dilatation of the ascending thoracic aorta measuring 40 mm in diameter, unchanged compared to the 07/2020 examination. Recommend annual imaging followup by CTA or MRA. This recommendation follows 2010 ACCF/AHA/AATS/ACR/ASA/SCA/SCAI/SIR/STS/SVM Guidelines for  the Diagnosis and Management of Patients with Thoracic Aortic Disease. Circulation. 2010; 121: T016-W109. Aortic aneurysm NOS (ICD10-I71.9) 2. Indeterminate approximately 1.9 cm potentially enhancing nodule arising from the anterior superior aspect of the right kidney, incompletely evaluated on the present examination and while potentially representative of a minimally complex cyst, nonenhancing renal lesions such as a renal cell carcinoma is not excluded on the basis of this examination. Further evaluation with abdominal MRI (preferably) or renal protocol CT could be performed as indicated. 3. At least 6.7 cm left-sided  parapelvic cyst versus extrarenal pelvis, incompletely imaged though may be further evaluated on above recommended cross-sectional imaging. 4. Coronary calcifications.  Aortic Atherosclerosis (ICD10-I70.0).  Recent Labs: 03/04/2021: ALT 23; BUN 15; Creatinine, Ser 1.16; Hemoglobin 13.5; Platelets 165.0; Potassium 4.5; Sodium 138   Recent Lipid Panel    Component Value Date/Time   CHOL 223 (H) 06/25/2020 0900   TRIG 46 06/25/2020 0900   HDL 73 06/25/2020 0900   CHOLHDL 3.1 06/25/2020 0900   VLDL 9.4 06/21/2019 0849   LDLCALC 136 (H) 06/25/2020 0900   LDLDIRECT 132.8 05/23/2013 0837    Physical Exam:   VS:  BP (!) 102/53   Pulse 65   Ht 5\' 10"  (1.778 m)   Wt 178 lb 6.4 oz (80.9 kg)   SpO2 97%   BMI 25.60 kg/m    Wt Readings from Last 3 Encounters:  06/10/21 178 lb 6.4 oz (80.9 kg)  05/07/21 173 lb (78.5 kg)  03/04/21 173 lb (78.5 kg)    General: Well nourished, well developed, in no acute distress Head: Atraumatic, normal size  Eyes: PEERLA, EOMI  Neck: Supple, no JVD Endocrine: No thryomegaly Cardiac: Normal S1, S2; RRR; no murmurs, rubs, or gallops Lungs: Clear to auscultation bilaterally, no wheezing, rhonchi or rales  Abd: Soft, nontender, no hepatomegaly  Ext: No edema, pulses 2+ Musculoskeletal: No deformities, BUE and BLE strength normal and  equal Skin: Warm and dry, no rashes   Neuro: Alert and oriented to person, place, time, and situation, CNII-XII grossly intact, no focal deficits  Psych: Normal mood and affect   ASSESSMENT:   JEHU MCCAUSLIN is a 60 y.o. male who presents for the following: 1. Agatston coronary artery calcium score less than 100   2. Mixed hyperlipidemia   3. Ascending aorta dilatation (HCC)     PLAN:   1. Agatston coronary artery calcium score less than 100 2. Mixed hyperlipidemia -Coronary calcium score 68 which is 52nd percentile.  No symptoms of obstructive CAD.  EKG is normal.  He is exercising 3 to 4 days/week.  This includes running up to 2 to 4 miles.  No symptoms of chest pain or trouble breathing.  I recommended continue this level of activity.  He reports no issues with diet. -Overall cholesterol goal should have an LDL less than 100, likely closer to 70.  He has no major risk factors for CVD other than an elevated calcium score.  He is currently on Crestor 10 mg on Thursdays and Saturdays.  He had issues with cramping on higher doses.  He has not tried a lipophilic statin such as Lipitor.  He may do better with this.  Regardless he has not had his cholesterol rechecked.  I would like to recheck his cholesterol next week when he is fasting.  We will see what his levels are on Crestor low-dose.  He may be at goal on this and we may not have to try another regimen for him.  We also discussed other agents such as Zetia, bempedoic acid and PCSK9 inhibitors.  I think we can just see where he is on Crestor and likely transition to Lipitor if not at goal first.  This would be a very easy move for him.  3. Ascending aorta dilatation (HCC) -Mild dilation of the acing aorta up to 40 mm.  This was seen on CT with contrast on 03/27/2021.  This is likely close to the upper limits of normal given his age.  I would expect 39 mm  to be the upper limit of normal for him. -No history of valvular heart disease but I  would like to check an echocardiogram to make sure he does not have a bicuspid aortic valve. -He is a long way from ever needing this repaired.  There is no family history of aortic dissection or sudden cardiac death to suggest this.  In the absence of other issues he would need to be repaired at 55 mm. -I do believe that he can exercise as he sees fit.  I would like to back off on his very stringent blood pressure goal by his primary care physician.  Given that he is close to a normal size aorta I believe we can stop his amlodipine and see how he does on valsartan.  In the absence of a genetically mediated aortopathy such as Marfan syndrome I really see no need for aggressive blood pressure control at this time.  Again he is close to the upper limits of normal. -We will plan to see him back in 3 months and see how he does off amlodipine.  If his echocardiogram matches his CT scan we may be able to follow this yearly with echo to avoid radiation.  If he has stability over a several year.  I suspect we can just forego screening.  We will have these discussions as we move forward.  Disposition: Return in about 3 months (around 09/10/2021).  Medication Adjustments/Labs and Tests Ordered: Current medicines are reviewed at length with the patient today.  Concerns regarding medicines are outlined above.  Orders Placed This Encounter  Procedures   Lipid panel   EKG 12-Lead   ECHOCARDIOGRAM COMPLETE    No orders of the defined types were placed in this encounter.   Patient Instructions  Medication Instructions:  STOP Amlodipine   *If you need a refill on your cardiac medications before your next appointment, please call your pharmacy*   Lab Work: LIPID (next week, no lab appointment needed, come back fasting- nothing to eat or drink)   If you have labs (blood work) drawn today and your tests are completely normal, you will receive your results only by: [place] (if you have MyChart) OR A  paper copy in the mail If you have any lab test that is abnormal or we need to change your treatment, we will call you to review the results.   Testing/Procedures: Echocardiogram - Your physician has requested that you have an echocardiogram. Echocardiography is a painless test that uses sound waves to create images of your heart. It provides your doctor with information about the size and shape of your heart and how well your heart's chambers and valves are working. This procedure takes approximately one hour. There are no restrictions for this procedure. This will be performed at either our [facility] location - [address] [place] location [facility] 2nd floor.    Follow-Up: At [facility], you and your health needs are our priority.  As part of our continuing mission to provide you with exceptional heart care, we have created designated Provider Care Teams.  These Care Teams include your primary Cardiologist (physician) and Advanced Practice Providers (APPs -  Physician Assistants and Nurse Practitioners) who all work together to provide you with the care you need, when you need it.  We recommend signing up for the patient portal called "MyChart".  Sign up information is provided on this After Visit Summary.  MyChart is used to  connect with patients for Virtual Visits (Telemedicine).  Patients are able to view lab/test results, encounter notes, upcoming appointments, etc.  Non-urgent messages can be sent to your provider as well.   To learn more about what you can do with MyChart, go to [email or web address].    Your next appointment:   3 month(s)  The format for your next appointment:   In Person  Provider:   Eleonore Chiquito, MD     Signed, Addison Naegeli. Audie Box, MD, [place]  [address] [place], [place] [address] [phone]  06/10/2021 10:39 AM

## 2021-06-10 ENCOUNTER — Encounter: Payer: Self-pay | Admitting: Cardiovascular Disease

## 2021-06-10 ENCOUNTER — Ambulatory Visit (INDEPENDENT_AMBULATORY_CARE_PROVIDER_SITE_OTHER): Payer: BC Managed Care – PPO | Admitting: Cardiovascular Disease

## 2021-06-10 VITALS — BP 102/53 | HR 65 | Ht 70.0 in | Wt 178.4 lb

## 2021-06-10 DIAGNOSIS — I77810 Thoracic aortic ectasia: Secondary | ICD-10-CM | POA: Diagnosis not present

## 2021-06-10 DIAGNOSIS — R931 Abnormal findings on diagnostic imaging of heart and coronary circulation: Secondary | ICD-10-CM | POA: Diagnosis not present

## 2021-06-10 DIAGNOSIS — E782 Mixed hyperlipidemia: Secondary | ICD-10-CM | POA: Diagnosis not present

## 2021-06-10 NOTE — Patient Instructions (Signed)
Medication Instructions:  STOP Amlodipine   *If you need a refill on your cardiac medications before your next appointment, please call your pharmacy*   Lab Work: LIPID (next week, no lab appointment needed, come back fasting- nothing to eat or drink)   If you have labs (blood work) drawn today and your tests are completely normal, you will receive your results only by: [place] (if you have MyChart) OR A paper copy in the mail If you have any lab test that is abnormal or we need to change your treatment, we will call you to review the results.   Testing/Procedures: Echocardiogram - Your physician has requested that you have an echocardiogram. Echocardiography is a painless test that uses sound waves to create images of your heart. It provides your doctor with information about the size and shape of your heart and how well your heart's chambers and valves are working. This procedure takes approximately one hour. There are no restrictions for this procedure. This will be performed at either our [facility] location - [address] [place] location [facility] 2nd floor.    Follow-Up: At [facility], you and your health needs are our priority.  As part of our continuing mission to provide you with exceptional heart care, we have created designated Provider Care Teams.  These Care Teams include your primary Cardiologist (physician) and Advanced Practice Providers (APPs -  Physician Assistants and Nurse Practitioners) who all work together to provide you with the care you need, when you need it.  We recommend signing up for the patient portal called "MyChart".  Sign up information is provided on this After Visit Summary.  MyChart is used to connect with patients for Virtual Visits (Telemedicine).  Patients are able to view lab/test results, encounter notes, upcoming appointments, etc.  Non-urgent messages can be sent to your provider as well.   To learn  more about what you can do with MyChart, go to [email or web address].    Your next appointment:   3 month(s)  The format for your next appointment:   In Person  Provider:   Eleonore Chiquito, MD

## 2021-06-15 DIAGNOSIS — E782 Mixed hyperlipidemia: Secondary | ICD-10-CM | POA: Diagnosis not present

## 2021-06-15 LAB — LIPID PANEL
Chol/HDL Ratio: 2.7 ratio (ref 0.0–5.0)
Cholesterol, Total: 189 mg/dL (ref 100–199)
HDL: 69 mg/dL (ref >39)
LDL Chol Calc (NIH): 112 mg/dL — ABNORMAL HIGH (ref 0–99)
Triglycerides: 39 mg/dL (ref 0–149)
VLDL Cholesterol Cal: 8 mg/dL (ref 5–40)

## 2021-06-16 ENCOUNTER — Other Ambulatory Visit: Payer: Self-pay

## 2021-06-16 DIAGNOSIS — E782 Mixed hyperlipidemia: Secondary | ICD-10-CM

## 2021-06-16 MED ORDER — EZETIMIBE 10 MG PO TABS: 10.0000 mg | ORAL_TABLET | Freq: Every day | ORAL | 3 refills | Status: DC

## 2021-06-17 DIAGNOSIS — Q6211 Congenital occlusion of ureteropelvic junction: Secondary | ICD-10-CM | POA: Diagnosis not present

## 2021-06-24 ENCOUNTER — Other Ambulatory Visit (HOSPITAL_COMMUNITY): Payer: Self-pay | Admitting: Urology

## 2021-06-24 ENCOUNTER — Other Ambulatory Visit: Payer: Self-pay | Admitting: Urology

## 2021-06-24 DIAGNOSIS — Q6211 Congenital occlusion of ureteropelvic junction: Secondary | ICD-10-CM

## 2021-06-25 NOTE — Progress Notes (Signed)
Phone: [phone]   Subjective:  Patient presents today for their annual physical. Chief complaint-noted.   See problem oriented charting- ROS- full  review of systems was completed and negative  per full ROS sheet  The following were reviewed and entered/updated in epic: Past Medical History:  Diagnosis Date   Adenomatous colon polyp    2013, 5 year repeat   Allergy    Coronary artery disease    Diverticulosis    HTN (hypertension)    Hyperlipidemia    no rx. total just above 200, HDL great, LDL in 120s 130s   Internal hemorrhoids    Leukopenia    stable in the 3s   Migraine    relpax about once a month   Patient Active Problem List   Diagnosis Date Noted   Thoracic aortic aneurysm 09/04/2020    Priority: High   Aortic atherosclerosis ([place]) 03/29/2021    Priority: Medium    BPPV (benign paroxysmal positional vertigo), left 12/28/2019    Priority: Medium    Essential hypertension 08/28/2019    Priority: Medium    Allergic rhinitis 06/16/2017    Priority: Medium    Migraine     Priority: Medium    Hyperlipidemia     Priority: Medium    Adenomatous colon polyp     Priority: Low   Leukopenia     Priority: Low   Medial epicondylitis of elbow, right 07/16/2019   Past Surgical History:  Procedure Laterality Date   ANAL FISSURE REPAIR  2007   COLONOSCOPY W/ POLYPECTOMY     HERNIA REPAIR     as infant; bilateral   right elbow surgery     VASECTOMY      Family History  Problem Relation Age of Onset   Lung cancer Mother        smoker, died 6 age 33   Heart failure Father        rheumatic fever-died 23 months after mother, did not take care of himself   Mitral valve prolapse Father    Colon cancer Maternal Grandfather        early 66s   Stomach cancer Neg Hx     Medications- reviewed and updated Current Outpatient Medications  Medication Sig Dispense Refill   ezetimibe (ZETIA) 10 MG tablet Take 1 tablet (10 mg total) by mouth daily. 90 tablet 3    fexofenadine (ALLEGRA) 180 MG tablet Take 180 mg by mouth daily.     Omega-3 Fatty Acids (FISH OIL) 1200 MG CAPS Take by mouth.     pantoprazole (PROTONIX) 40 MG tablet Take 1 tablet (40 mg total) by mouth daily. 90 tablet 1   rosuvastatin (CRESTOR) 10 MG tablet Take 1 tablet (10 mg total) by mouth daily. (Patient taking differently: Take 10 mg by mouth every other day. Take 10mg  twice week (Sunday and Thursday)) 90 tablet 3   valsartan (DIOVAN) 80 MG tablet TAKE 1 TABLET(80 MG) BY MOUTH DAILY. (Patient taking differently: Take 80 mg by mouth daily.) 30 tablet 5   No current facility-administered medications for this visit.    Allergies-reviewed and updated No Known Allergies  Social History   Social History Narrative   Married (wife patient of Dr. 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    Prior Worked at [employer] handed,three story house   Hobbies: running, biking, golf   Objective  Objective:  BP 108/68   Pulse 60   Temp 98.3 F (36.8 C)   Ht 5\' 10"  (1.778 m)   Wt 179 lb 9.6 oz (81.5 kg)   SpO2 94%   BMI 25.77 kg/m  Gen: NAD, resting comfortably HEENT: Mucous membranes are moist. Oropharynx normal Neck: no thyromegaly CV: RRR no murmurs rubs or gallops Lungs: CTAB no crackles, wheeze, rhonchi Abdomen: soft/nontender/nondistended/normal bowel sounds. No rebound or guarding.  Ext: no edema Skin: warm, dry Neuro: grossly normal, moves all extremities, PERRLA   Assessment and Plan  60 y.o. male presenting for annual physical.  Health Maintenance counseling: 1. Anticipatory guidance: Patient counseled regarding regular dental exams -q6 months, eye exams -twice yearly as glaucoma suspect,  avoiding smoking and second  hand smoke , limiting alcohol to 2 beverages per day, no illicit drug use. 2. Risk factor reduction:  Advised patient of need for regular exercise and diet rich and fruits and vegetables to reduce risk of heart attack and stroke.  Exercise- exercising 5 days per week and running at least 2-4 miles.  Diet/weight management-reasonably healthy diet - he prefers to be closer to 170.  Wt Readings from Last 3 Encounters:  06/30/21 179 lb 9.6 oz (81.5 kg)  06/10/21 178 lb 6.4 oz (80.9 kg)  05/07/21 173 lb (78.5 kg)  3. Immunizations/screenings/ancillary studies- immunizations up-to-date.  Immunization History  Administered Date(s) Administered   Influenza Inj Mdck Quad Pf 04/25/2019   Influenza Split 04/10/2012, 03/28/2013   Influenza,inj,Quad PF,6+ Mos 05/09/2015, 03/18/2016, 05/18/2018, 05/18/2019, 04/21/2021   Influenza-Unspecified 04/18/2013, 05/13/2014, 04/11/2017, 05/18/2018, 04/23/2020   PFIZER(Purple Top)SARS-COV-2 Vaccination 10/04/2019, 10/29/2019, 05/22/2020   Pfizer Covid-19 Vaccine Bivalent Booster 68yrs & up 06/21/2021   Td 07/28/2007   Tdap 06/19/2018   Zoster Recombinat (Shingrix) 06/19/2018, 09/28/2018  4. Prostate cancer screening- low risk prior PSA trend-continue to trend labs today.  Defer rectal mass concerning trend Lab Results  Component Value Date   PSA 0.54 06/25/2020   PSA 0.53 06/21/2019   PSA 0.52 06/19/2018   5. Colon cancer screening -  history of adenomatous polyp of the colon but not on last colonoscopy 05/19/2017 - 10-year repeat recommended 6. Skin cancer screening- follows with Dr. Delman Cheadle 1-2 times a year depending on findings- mohs in the past. Advised regular sunscreen use. Denies worrisome, changing, or new skin lesions.  7. Smoking associated screening (lung cancer screening, AAA screen 65-75, UA)- Never smoker 8. STD screening - opts out as monogamous with wife  Status of chronic or acute concerns   # aortic aneurysm thoracic S: Incidental finding on  imaging for coronary calcium scoring at 42 mm in January 2022.  On CT angiogram March 27, 2021 was noted as 40 mm/unchanged with 1 year follow-up planned A/P: Stable on most recent imaging-continue close follow-up -Systolic blood pressure goal 105-120 I had noted previously but cardiology states this is very close to upper limit of normal and do not need to be as aggressive - annual CT angiogram planned -avoid quiniolone antibiotics if possible  #hypertension-more aggressive blood pressure goal due to aneurysm S: medication: valsartan 80 mg daily - Amlodipine 10 mg was stopped 06/10/2021 per Dr. Audie Box due to his aorta being close to a normal size  - follow-up in 3 months Home readings #s: some variation on home readings 122/76 most  recently  BP Readings from Last 3 Encounters:  06/30/21 108/68  06/10/21 (!) 102/53  05/07/21 111/68  A/P:  Controlled. Continue current medications.    #Left hydronephrosis- dilated left renal pelvis- has test on Monday with Dr. Jeffie Pollock [facility] renal imaging flow. Apparently there is potential intervention that Dr. Jeffie Pollock states may need with another person in practice   #hyperlipidemia with coronary calcium score of 68 but focus of calcium in proximal LAD. LDL before starting medication 136 S: Medication:Rosuvastatin 10 mg on Thursdays and Sunday-due to myalgias and exercise intolerance.  Since lipids not recently at goal and Zetia 10 mg was added with no side effects thankfully - Dr. Audie Box from Cardiology could potentially switch patient over to atorvastatin if not at goal in future to see if that makes a difference.  At last visit he was okay with LDL at least being under 100 but ideally would be under 70 - no chest pain or shortness of breath Lab Results  Component Value Date   CHOL 189 06/15/2021   HDL 69 06/15/2021   LDLCALC 112 (H) 06/15/2021   LDLDIRECT 132.8 05/23/2013   TRIG 39 06/15/2021   CHOLHDL 2.7 06/15/2021  A/P: HLD goal per cardiology LDL  under 100- we will recheck mid January. No chest pain or sob- but not expected with such minimal plaque  # GERD S:Medication: Pantoprazole 40 mg daily per Dr. Hilarie Fredrickson . Flare ups with every other day A/P: Controlled. Continue current medications.    #BPPV starting 2021- referred to vestibular rehab in January 2022- reports mild intermittent issues but doesn't stop him.   #Headaches- better lately off Claritin D and with better hydration. Also on riboflavin and coq10.   Recommended follow up: 6 months to 1 year follow up  Future Appointments  Date Time Provider [place]  07/06/2021  2:00 PM [facility] 1 [facility] [place]  09/11/2021  2:20 PM O'Neal, Cassie Freer, MD [facility]   Lab/Order associations: fasting but we are going to do labs later   ICD-10-CM   1. Preventative health care  Z00.00     2. Essential hypertension  I10     3. Hyperlipidemia, unspecified hyperlipidemia type  E78.5     4. BPPV (benign paroxysmal positional vertigo), left  H81.12     5. Aneurysm of ascending aorta without rupture  I71.21     6. Screening for prostate cancer  Z12.5       No orders of the defined types were placed in this encounter.  I,Harris Phan,acting as a Education administrator for Garret Reddish, MD.,have documented all relevant documentation on the behalf of Garret Reddish, MD,as directed by  Garret Reddish, MD while in the presence of Garret Reddish, MD.  I, Garret Reddish, MD, have reviewed all documentation for this visit. The documentation on 06/30/21 for the exam, diagnosis, procedures, and orders are all accurate and complete.  Return precautions advised.  Garret Reddish, MD

## 2021-06-25 NOTE — Patient Instructions (Addendum)
Please schedule a lab visit for mid January today at the front check out desk before you leave. Return fasting meaning nothing but water after midnight please. Ok to take your medications with water.   Thank you for having you immunizations up-to-date!  keep up the good work and try to maintain your weight over the holidays.  Recommended follow up: Return in about 6 months (around 12/29/2021) for a follow-up or you could space your visit out further if you would like . For 1 year physical

## 2021-06-26 ENCOUNTER — Other Ambulatory Visit: Payer: Self-pay

## 2021-06-26 ENCOUNTER — Ambulatory Visit (INDEPENDENT_AMBULATORY_CARE_PROVIDER_SITE_OTHER): Payer: BC Managed Care – PPO

## 2021-06-26 DIAGNOSIS — I77810 Thoracic aortic ectasia: Secondary | ICD-10-CM | POA: Diagnosis not present

## 2021-06-26 LAB — ECHOCARDIOGRAM COMPLETE
AR max vel: 2.81 cm2
AV Area VTI: 3.18 cm2
AV Area mean vel: 3.08 cm2
AV Mean grad: 3 mmHg
AV Peak grad: 6.4 mmHg
Ao pk vel: 1.26 m/s
Area-P 1/2: 2.62 cm2
Calc EF: 61.4 %
S' Lateral: 3.49 cm
Single Plane A2C EF: 60.5 %
Single Plane A4C EF: 63.6 %

## 2021-06-28 ENCOUNTER — Encounter: Payer: Self-pay | Admitting: Family Medicine

## 2021-06-30 ENCOUNTER — Encounter: Payer: Self-pay | Admitting: Family Medicine

## 2021-06-30 ENCOUNTER — Other Ambulatory Visit: Payer: Self-pay

## 2021-06-30 ENCOUNTER — Ambulatory Visit (INDEPENDENT_AMBULATORY_CARE_PROVIDER_SITE_OTHER): Payer: BC Managed Care – PPO | Admitting: Family Medicine

## 2021-06-30 VITALS — BP 108/68 | HR 60 | Temp 98.3°F | Ht 70.0 in | Wt 179.6 lb

## 2021-06-30 DIAGNOSIS — E785 Hyperlipidemia, unspecified: Secondary | ICD-10-CM | POA: Diagnosis not present

## 2021-06-30 DIAGNOSIS — Z0000 Encounter for general adult medical examination without abnormal findings: Secondary | ICD-10-CM

## 2021-06-30 DIAGNOSIS — Z125 Encounter for screening for malignant neoplasm of prostate: Secondary | ICD-10-CM

## 2021-06-30 DIAGNOSIS — H8112 Benign paroxysmal vertigo, left ear: Secondary | ICD-10-CM

## 2021-06-30 DIAGNOSIS — I10 Essential (primary) hypertension: Secondary | ICD-10-CM

## 2021-06-30 DIAGNOSIS — I7121 Aneurysm of the ascending aorta, without rupture: Secondary | ICD-10-CM

## 2021-07-01 ENCOUNTER — Encounter: Payer: Self-pay | Admitting: Family Medicine

## 2021-07-06 ENCOUNTER — Other Ambulatory Visit: Payer: Self-pay

## 2021-07-06 ENCOUNTER — Ambulatory Visit (HOSPITAL_COMMUNITY)
Admission: RE | Admit: 2021-07-06 | Discharge: 2021-07-06 | Disposition: A | Discharge status: Home / Self Care | Payer: BC Managed Care – PPO | Source: Ambulatory Visit | Attending: Urology | Admitting: Urology

## 2021-07-06 DIAGNOSIS — Q6211 Congenital occlusion of ureteropelvic junction: Secondary | ICD-10-CM | POA: Insufficient documentation

## 2021-07-06 DIAGNOSIS — N281 Cyst of kidney, acquired: Secondary | ICD-10-CM | POA: Diagnosis not present

## 2021-07-06 DIAGNOSIS — N1330 Unspecified hydronephrosis: Secondary | ICD-10-CM | POA: Diagnosis not present

## 2021-07-06 DIAGNOSIS — N19 Unspecified kidney failure: Secondary | ICD-10-CM | POA: Diagnosis not present

## 2021-07-06 MED ORDER — FUROSEMIDE 10 MG/ML IJ SOLN: 40.0000 mg | Freq: Once | INTRAMUSCULAR | Status: DC

## 2021-07-06 MED ORDER — TECHNETIUM TC 99M MERTIATIDE
4.8900 | Freq: Once | INTRAVENOUS | Status: AC
  Administered 2021-07-06: 14:00:00 4.89 via INTRAVENOUS

## 2021-07-06 MED ORDER — FUROSEMIDE 10 MG/ML IJ SOLN
INTRAMUSCULAR | Status: AC
  Filled 2021-07-06: qty 4

## 2021-08-28 DIAGNOSIS — Q6211 Congenital occlusion of ureteropelvic junction: Secondary | ICD-10-CM | POA: Diagnosis not present

## 2021-09-01 ENCOUNTER — Encounter: Payer: Self-pay | Admitting: Family Medicine

## 2021-09-01 ENCOUNTER — Other Ambulatory Visit: Payer: Self-pay

## 2021-09-01 ENCOUNTER — Other Ambulatory Visit: Payer: Self-pay | Admitting: *Deleted

## 2021-09-01 MED ORDER — ROSUVASTATIN CALCIUM 10 MG PO TABS: 10.0000 mg | ORAL_TABLET | Freq: Every day | ORAL | 3 refills | Status: DC

## 2021-09-01 MED ORDER — PANTOPRAZOLE SODIUM 40 MG PO TBEC: 40.0000 mg | DELAYED_RELEASE_TABLET | Freq: Every day | ORAL | 0 refills | Status: DC

## 2021-09-11 ENCOUNTER — Ambulatory Visit: Payer: BC Managed Care – PPO | Admitting: Cardiovascular Disease

## 2021-09-21 ENCOUNTER — Other Ambulatory Visit: Payer: Self-pay

## 2021-09-21 MED ORDER — VALSARTAN 80 MG PO TABS: 80.0000 mg | ORAL_TABLET | Freq: Every day | ORAL | 3 refills | Status: DC

## 2021-11-26 NOTE — Progress Notes (Signed)
?Cardiology Office Note:   ?Date:  11/27/2021  ?NAME:  Matthew Fox    ?MRN: [id number] ?DOB:  1961-01-17  ? ?PCP:  Marin Olp, MD  ?Cardiologist:  None  ?Electrophysiologist:  None  ? ?Referring MD: Marin Olp, MD  ? ?Chief Complaint  ?Patient presents with  ? Follow-up  ?   ?  ? ?History of Present Illness:   ?Matthew Fox is a 61 y.o. male with a hx of coronary calcium, hyperlipidemia who presents for follow-up.  Issues with statins.  Zetia was added.  He reports he is tolerating Zetia well.  Denies any chest pain or trouble breathing.  He is running 3 to 4 days/week.  Also doing weight training.  No issues.  Fasting today.  Needs recheck on cholesterol.  Blood pressure is well controlled.  No need for medication.  Aorta measurements are within limits. ? ?Problem List ?Coronary calcium ?-CAC score 68 (52nd percentile) ?2. Ascending aortic dilation  ?-03/2021 40 mm  ?3. HLD ?-T chol 189, HDL 69, LDL 112, TG 39 ? ?Past Medical History: ?Past Medical History:  ?Diagnosis Date  ? Adenomatous colon polyp   ? 2013, 5 year repeat  ? Allergy   ? Coronary artery disease   ? Diverticulosis   ? HTN (hypertension)   ? Hyperlipidemia   ? no rx. total just above 200, HDL great, LDL in 120s 130s  ? Internal hemorrhoids   ? Leukopenia   ? stable in the 3s  ? Migraine   ? relpax about once a month  ? ? ?Past Surgical History: ?Past Surgical History:  ?Procedure Laterality Date  ? ANAL FISSURE REPAIR  2007  ? COLONOSCOPY W/ POLYPECTOMY    ? HERNIA REPAIR    ? as infant; bilateral  ? right elbow surgery    ? VASECTOMY    ? ? ?Current Medications: ?Current Meds  ?Medication Sig  ? ezetimibe (ZETIA) 10 MG tablet Take 1 tablet (10 mg total) by mouth daily.  ? fexofenadine (ALLEGRA) 180 MG tablet Take 180 mg by mouth daily.  ? Omega-3 Fatty Acids (FISH OIL) 1200 MG CAPS Take by mouth.  ? pantoprazole (PROTONIX) 40 MG tablet Take 1 tablet (40 mg total) by mouth daily.  ? rosuvastatin (CRESTOR) 10 MG tablet Take 1  tablet (10 mg total) by mouth daily.  ? valsartan (DIOVAN) 80 MG tablet Take 1 tablet (80 mg total) by mouth daily.  ?  ? ?Allergies:    ?Patient has no known allergies.  ? ?Social History: ?Social History  ? ?Socioeconomic History  ? Marital status: Married  ?  Spouse name: Not on file  ? Number of children: 2  ? Years of education: Not on file  ? Highest education level: Not on file  ?Occupational History  ? Occupation: Facilities manager  ?Tobacco Use  ? Smoking status: Never  ? Smokeless tobacco: Never  ?Substance and Sexual Activity  ? Alcohol use: Yes  ?  Alcohol/week: 7.0 standard drinks  ?  Types: 7 Standard drinks or equivalent per week  ? Drug use: No  ? Sexual activity: Not on file  ?Other Topics Concern  ? Not on file  ?Social History Narrative  ? Married (wife patient of Dr. Yong Channel), 2 daughters Janett Billow NP (in [place] with palliative care, had been in [place] with oncology) and Lilia Pro  ([employer] grad going into insurance in [place] 2019)   ? - will be grandfather June 2023  ?   ?  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   ? Prior Worked at [employer]  ? Right handed,three story house  ? Hobbies: running, biking, golf  ? ?Social Determinants of Health  ? ?Financial Resource Strain: Not on file  ?Food Insecurity: Not on file  ?Transportation Needs: Not on file  ?Physical Activity: Not on file  ?Stress: Not on file  ?Social Connections: Not on file  ?  ? ?Family History: ?The patient's family history includes Colon cancer in his maternal grandfather; Heart failure in his father; Lung cancer in his mother; Mitral valve prolapse in his father. There is no history of Stomach cancer. ? ?ROS:   ?All other ROS reviewed and negative. Pertinent positives noted in the HPI.    ? ?EKGs/Labs/Other Studies Reviewed:   ?The following studies were personally reviewed by me today: ? ?Recent Labs: ?03/04/2021: ALT 23; BUN 15; Creatinine, Ser 1.16;  Hemoglobin 13.5; Platelets 165.0; Potassium 4.5; Sodium 138  ? ?Recent Lipid Panel ?   ?Component Value Date/Time  ? CHOL 189 06/15/2021 0811  ? TRIG 39 06/15/2021 0811  ? HDL 69 06/15/2021 0811  ? CHOLHDL 2.7 06/15/2021 0811  ? CHOLHDL 3.1 06/25/2020 0900  ? VLDL 9.4 06/21/2019 0849  ? [place] 112 (H) 06/15/2021 2683  ? LDLCALC 136 (H) 06/25/2020 0900  ? LDLDIRECT 132.8 05/23/2013 0837  ? ? ?Physical Exam:   ?VS:  BP 106/74 (BP Location: Left Arm, Patient Position: Sitting, Cuff Size: Normal)   Pulse (!) 54   Ht '5\' 10"'$  (1.778 m)   Wt 177 lb 3.2 oz (80.4 kg)   SpO2 96%   BMI 25.43 kg/m?    ?Wt Readings from Last 3 Encounters:  ?11/27/21 177 lb 3.2 oz (80.4 kg)  ?06/30/21 179 lb 9.6 oz (81.5 kg)  ?06/10/21 178 lb 6.4 oz (80.9 kg)  ?  ?General: Well nourished, well developed, in no acute distress ?Head: Atraumatic, normal size  ?Eyes: PEERLA, EOMI  ?Neck: Supple, no JVD ?Endocrine: No thryomegaly ?Cardiac: Normal S1, S2; RRR; no murmurs, rubs, or gallops ?Lungs: Clear to auscultation bilaterally, no wheezing, rhonchi or rales  ?Abd: Soft, nontender, no hepatomegaly  ?Ext: No edema, pulses 2+ ?Musculoskeletal: No deformities, BUE and BLE strength normal and equal ?Skin: Warm and dry, no rashes   ?Neuro: Alert and oriented to person, place, time, and situation, CNII-XII grossly intact, no focal deficits  ?Psych: Normal mood and affect  ? ?ASSESSMENT:   ?Matthew Fox is a 61 y.o. male who presents for the following: ?1. [place]atston coronary artery calcium score less than 100   ?2. Mixed hyperlipidemia   ?3. Ascending aorta dilatation (HCC)   ? ? ?PLAN:   ?1. Agatston coronary artery calcium score less than 100 ?2. Mixed hyperlipidemia ?-Coronary calcium score 68.  Started on Zetia to help with lowering cholesterol.  On Crestor 10 mg 2 days/week.  We will recheck fasting lipid today.  Given his cholesterol score is below the 75th percentile believe values less than 100 okay.  Echo normal. ? ?3. Ascending aorta  dilatation (HCC) ?-Mildly dilated aorta.  Likely normal for age.  Recheck echo next year.  As long as this is stable we will likely forego further testing. ? ?Disposition: Return in about 1 year (around 11/28/2022). ? ?Medication Adjustments/Labs and Tests Ordered: ?Current medicines are reviewed at length with the patient today.  Concerns regarding medicines are outlined above.  ?Orders Placed This  Encounter  ?Procedures  ? Lipid panel  ? ECHOCARDIOGRAM COMPLETE  ? ?No orders of the defined types were placed in this encounter. ? ? ?Patient Instructions  ?Medication Instructions:  ?The current medical regimen is effective;  continue present plan and medications. ? ?*If you need a refill on your cardiac medications before your next appointment, please call your pharmacy* ? ? ?Lab Work: ?LIPID today  ? ?If you have labs (blood work) drawn today and your tests are completely normal, you will receive your results only by: ?MyChart Message (if you have MyChart) OR ?A paper copy in the mail ?If you have any lab test that is abnormal or we need to change your treatment, we will call you to review the results. ? ? ?Testing/Procedures: ?Echocardiogram (12 month) - Your physician has requested that you have an echocardiogram. Echocardiography is a painless test that uses sound waves to create images of your heart. It provides your doctor with information about the size and shape of your heart and how well your heart?s chambers and valves are working. This procedure takes approximately one hour. There are no restrictions for this procedure.  ? ? ? ?Follow-Up: ?At [facility], you and your health needs are our priority.  As part of our continuing mission to provide you with exceptional heart care, we have created designated Provider Care Teams.  These Care Teams include your primary Cardiologist (physician) and Advanced Practice Providers (APPs -  Physician Assistants and Nurse Practitioners) who all work together to provide  you with the care you need, when you need it. ? ?We recommend signing up for the patient portal called "MyChart".  Sign up information is provided on this After Visit Summary.  MyChart is used to connect with pa

## 2021-11-27 ENCOUNTER — Ambulatory Visit (INDEPENDENT_AMBULATORY_CARE_PROVIDER_SITE_OTHER): Payer: BC Managed Care – PPO | Admitting: Cardiovascular Disease

## 2021-11-27 ENCOUNTER — Encounter: Payer: Self-pay | Admitting: Cardiovascular Disease

## 2021-11-27 VITALS — BP 106/74 | HR 54 | Ht 70.0 in | Wt 177.2 lb

## 2021-11-27 DIAGNOSIS — I77810 Thoracic aortic ectasia: Secondary | ICD-10-CM | POA: Diagnosis not present

## 2021-11-27 DIAGNOSIS — R931 Abnormal findings on diagnostic imaging of heart and coronary circulation: Secondary | ICD-10-CM | POA: Diagnosis not present

## 2021-11-27 DIAGNOSIS — E782 Mixed hyperlipidemia: Secondary | ICD-10-CM | POA: Diagnosis not present

## 2021-11-27 LAB — LIPID PANEL
Chol/HDL Ratio: 2 ratio (ref 0.0–5.0)
Cholesterol, Total: 158 mg/dL (ref 100–199)
HDL: 80 mg/dL (ref >39)
LDL Chol Calc (NIH): 70 mg/dL (ref 0–99)
Triglycerides: 30 mg/dL (ref 0–149)
VLDL Cholesterol Cal: 8 mg/dL (ref 5–40)

## 2021-11-27 NOTE — Patient Instructions (Signed)
Medication Instructions:  ?The current medical regimen is effective;  continue present plan and medications. ? ?*If you need a refill on your cardiac medications before your next appointment, please call your pharmacy* ? ? ?Lab Work: ?LIPID today  ? ?If you have labs (blood work) drawn today and your tests are completely normal, you will receive your results only by: ?MyChart Message (if you have MyChart) OR ?A paper copy in the mail ?If you have any lab test that is abnormal or we need to change your treatment, we will call you to review the results. ? ? ?Testing/Procedures: ?Echocardiogram (12 month) - Your physician has requested that you have an echocardiogram. Echocardiography is a painless test that uses sound waves to create images of your heart. It provides your doctor with information about the size and shape of your heart and how well your heart?s chambers and valves are working. This procedure takes approximately one hour. There are no restrictions for this procedure.  ? ? ? ?Follow-Up: ?At [facility], you and your health needs are our priority.  As part of our continuing mission to provide you with exceptional heart care, we have created designated Provider Care Teams.  These Care Teams include your primary Cardiologist (physician) and Advanced Practice Providers (APPs -  Physician Assistants and Nurse Practitioners) who all work together to provide you with the care you need, when you need it. ? ?We recommend signing up for the patient portal called "MyChart".  Sign up information is provided on this After Visit Summary.  MyChart is used to connect with patients for Virtual Visits (Telemedicine).  Patients are able to view lab/test results, encounter notes, upcoming appointments, etc.  Non-urgent messages can be sent to your provider as well.   ?To learn more about what you can do with MyChart, go to [email or web address].   ? ?Your next appointment:   ?12 month(s) ? ?The format for your next  appointment:   ?In Person ? ?Provider:   ?Eleonore Chiquito, MD  ? ? ? ? ? ? ? ? ?

## 2021-12-06 ENCOUNTER — Other Ambulatory Visit: Payer: Self-pay | Admitting: Internal Medicine

## 2021-12-20 ENCOUNTER — Encounter: Payer: Self-pay | Admitting: Internal Medicine

## 2021-12-21 ENCOUNTER — Other Ambulatory Visit: Payer: Self-pay

## 2021-12-21 MED ORDER — PANTOPRAZOLE SODIUM 40 MG PO TBEC: 40.0000 mg | DELAYED_RELEASE_TABLET | Freq: Every day | ORAL | 0 refills | Status: DC

## 2022-01-07 ENCOUNTER — Telehealth: Payer: Self-pay | Admitting: Internal Medicine

## 2022-01-07 NOTE — Telephone Encounter (Signed)
Spoke with patient to make his appointment for an OV with Dr. Hilarie Fredrickson for medication refills.  The first appointment Dr. Hilarie Fredrickson had was 9/12 at 2:10.  Patient will need refills to hold him until this appointment.  If there is an issue, please call patient and advise.

## 2022-02-23 ENCOUNTER — Other Ambulatory Visit: Payer: Self-pay | Admitting: Internal Medicine

## 2022-03-26 ENCOUNTER — Other Ambulatory Visit: Payer: Self-pay | Admitting: Surgery

## 2022-03-26 DIAGNOSIS — I712 Thoracic aortic aneurysm, without rupture, unspecified: Secondary | ICD-10-CM

## 2022-03-30 ENCOUNTER — Encounter: Payer: Self-pay | Admitting: Internal Medicine

## 2022-03-30 ENCOUNTER — Ambulatory Visit (INDEPENDENT_AMBULATORY_CARE_PROVIDER_SITE_OTHER): Payer: BC Managed Care – PPO | Admitting: Internal Medicine

## 2022-03-30 VITALS — BP 116/64 | HR 72 | Ht 70.0 in | Wt 178.0 lb

## 2022-03-30 DIAGNOSIS — Z86010 Personal history of colonic polyps: Secondary | ICD-10-CM | POA: Diagnosis not present

## 2022-03-30 DIAGNOSIS — K219 Gastro-esophageal reflux disease without esophagitis: Secondary | ICD-10-CM | POA: Diagnosis not present

## 2022-03-30 MED ORDER — PANTOPRAZOLE SODIUM 20 MG PO TBEC: 20.0000 mg | DELAYED_RELEASE_TABLET | Freq: Every day | ORAL | 3 refills | Status: DC

## 2022-03-30 NOTE — Progress Notes (Signed)
   Subjective:    Patient ID: RAQUEL SAYRES, male    DOB: [birth year], 61 y.o.   MRN: [id number]  HPI Susan Arana is a 61 year old male with a past medical history of nonadvanced adenomatous colon polyp, GERD/LPR, diverticulosis, hypertension, hyperlipidemia and migraines who is here for follow-up.  He was last seen in July 2022.  He is here alone today.  He has been maintained on pantoprazole 40 mg a day.  He notes that this has been definitively helpful.  He feels like he could "eat a box of nails and it would not bother me".  He has still had some throat clearing which she wonders if is not somewhat a nervous habit but has had no further heartburn and no swallowing dysfunction.  Heartburn again has been gone with this medication.  He wonders if he can wean off of this medication.  No abdominal pain.  No change in bowel habit.  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  This is a very positive change for him and he was able to have an 8-week sabbatical occurring recently which he thoroughly enjoyed.  He does have a plan soon to go to [facility] with his wife.   Review of Systems As per HPI, otherwise negative  Current Medications, Allergies, Past Medical History, Past Surgical History, Family History and Social History were reviewed in [employer] record.    Objective:   Physical Exam BP 116/64   Pulse 72   Ht '5\' 10"'$  (1.778 m)   Wt 178 lb (80.7 kg)   BMI 25.54 kg/m  Gen: awake, alert, NAD HEENT: anicteric Neuro: nonfocal     Assessment & Plan:  61 year old male with a past medical history of nonadvanced adenomatous colon polyp, GERD/LPR, diverticulosis, hypertension, hyperlipidemia and migraines who is here for follow-up.    GERD/LPR --favorable response to pantoprazole though he is interested in weaning off of this medication to determine if it is truly necessary.  This is certainly reasonable.  Were going to try to  reduce pantoprazole to 20 mg daily.  I did tell him about the rebound effect that can happen when PPIs discontinued due to elevated gastrin levels.  This is only expected for short period of time and people can often help with tapering off by using famotidine 20 mg twice daily for a week or 2 after PPI discontinuation.  We also discussed that he may find he needs the lower dose PPI on an ongoing basis due to heartburn symptoms and LPR symptoms.  We did review the risks of chronic PPI therapy which for him is overall very low particularly if it continues to benefit him.  Plan: -- Reduce pantoprazole to 20 mg once daily -- Patient can attempt to stop PPI altogether and use famotidine 20 mg twice daily for 1 to 2 weeks and then twice daily on an as-needed basis going forward.  It is also okay to resume lower dose PPI more chronically if symptoms warrant. -- Consider upper endoscopy at next colonoscopy interval; no alarm symptoms to warrant endoscopy at this time  2.  History of nonadvanced adenomatous colon polyp 2013 --normal colonoscopy in 2018 with the exception of diverticulosis and hemorrhoids. -- Surveillance colonoscopy recommended November 2028  He can follow-up in 2 years, sooner if needed  20 minutes total spent today including patient facing time, coordination of care, reviewing medical history/procedures/pertinent radiology studies, and documentation of the encounter.

## 2022-03-30 NOTE — Patient Instructions (Signed)
_______________________________________________________  If you are age 60 or older, your body mass index should be between 23-30. Your Body mass index is 25.54 kg/m. If this is out of the aforementioned range listed, please consider follow up with your Primary Care Provider.  If you are age 56 or younger, your body mass index should be between 19-25. Your Body mass index is 25.54 kg/m. If this is out of the aformentioned range listed, please consider follow up with your Primary Care Provider.   ________________________________________________________  The [facility] providers would like to encourage you to use [facility] to communicate with providers for non-urgent requests or questions.  Due to long hold times on the telephone, sending your provider a message by [facility] may be a faster and more efficient way to get a response.  Please allow 48 business hours for a response.  Please remember that this is for non-urgent requests.  _______________________________________________________  We have sent the following medications to your pharmacy for you to pick up at your convenience:  Pantoprazole '20mg'$   Please follow up in two years

## 2022-03-30 NOTE — Progress Notes (Signed)
Gerd

## 2022-04-06 DIAGNOSIS — D2371 Other benign neoplasm of skin of right lower limb, including hip: Secondary | ICD-10-CM | POA: Diagnosis not present

## 2022-04-06 DIAGNOSIS — L578 Other skin changes due to chronic exposure to nonionizing radiation: Secondary | ICD-10-CM | POA: Diagnosis not present

## 2022-04-06 DIAGNOSIS — D225 Melanocytic nevi of trunk: Secondary | ICD-10-CM | POA: Diagnosis not present

## 2022-04-06 DIAGNOSIS — L821 Other seborrheic keratosis: Secondary | ICD-10-CM | POA: Diagnosis not present

## 2022-04-12 ENCOUNTER — Encounter: Payer: Self-pay | Admitting: *Deleted

## 2022-05-06 ENCOUNTER — Other Ambulatory Visit: Payer: BC Managed Care – PPO

## 2022-05-06 ENCOUNTER — Ambulatory Visit: Payer: BC Managed Care – PPO

## 2022-05-26 ENCOUNTER — Other Ambulatory Visit: Payer: Self-pay | Admitting: Cardiovascular Disease

## 2022-07-01 ENCOUNTER — Encounter: Payer: Self-pay | Admitting: Family Medicine

## 2022-07-01 ENCOUNTER — Ambulatory Visit (INDEPENDENT_AMBULATORY_CARE_PROVIDER_SITE_OTHER): Payer: BC Managed Care – PPO | Admitting: Family Medicine

## 2022-07-01 VITALS — BP 104/70 | HR 54 | Temp 98.2°F | Ht 70.0 in | Wt 179.4 lb

## 2022-07-01 DIAGNOSIS — Z0000 Encounter for general adult medical examination without abnormal findings: Secondary | ICD-10-CM | POA: Diagnosis not present

## 2022-07-01 DIAGNOSIS — Z125 Encounter for screening for malignant neoplasm of prostate: Secondary | ICD-10-CM

## 2022-07-01 DIAGNOSIS — E785 Hyperlipidemia, unspecified: Secondary | ICD-10-CM | POA: Diagnosis not present

## 2022-07-01 LAB — CBC WITH DIFFERENTIAL/PLATELET
Basophils Absolute: 0 10*3/uL (ref 0.0–0.1)
Basophils Relative: 1.1 % (ref 0.0–3.0)
Eosinophils Absolute: 0.1 10*3/uL (ref 0.0–0.7)
Eosinophils Relative: 4.8 % (ref 0.0–5.0)
HCT: 43.2 % (ref 39.0–52.0)
Hemoglobin: 14.7 g/dL (ref 13.0–17.0)
Lymphocytes Relative: 22.8 % (ref 12.0–46.0)
Lymphs Abs: 0.7 10*3/uL (ref 0.7–4.0)
MCHC: 34.1 g/dL (ref 30.0–36.0)
MCV: 93.5 fl (ref 78.0–100.0)
Monocytes Absolute: 0.3 10*3/uL (ref 0.1–1.0)
Monocytes Relative: 10.4 % (ref 3.0–12.0)
Neutro Abs: 1.9 10*3/uL (ref 1.4–7.7)
Neutrophils Relative %: 60.9 % (ref 43.0–77.0)
Platelets: 176 10*3/uL (ref 150.0–400.0)
RBC: 4.62 Mil/uL (ref 4.22–5.81)
RDW: 13.3 % (ref 11.5–15.5)
WBC: 3.1 10*3/uL — ABNORMAL LOW (ref 4.0–10.5)

## 2022-07-01 LAB — LIPID PANEL
Cholesterol: 167 mg/dL (ref 0–200)
HDL: 69.9 mg/dL (ref >39.00)
LDL Cholesterol: 88 mg/dL (ref 0–99)
NonHDL: 97.35
Total CHOL/HDL Ratio: 2
Triglycerides: 49 mg/dL (ref 0.0–149.0)
VLDL: 9.8 mg/dL (ref 0.0–40.0)

## 2022-07-01 LAB — PSA: PSA: 0.54 ng/mL (ref 0.10–4.00)

## 2022-07-01 LAB — COMPREHENSIVE METABOLIC PANEL
ALT: 16 U/L (ref 0–53)
AST: 18 U/L (ref 0–37)
Albumin: 4.4 g/dL (ref 3.5–5.2)
Alkaline Phosphatase: 55 U/L (ref 39–117)
BUN: 18 mg/dL (ref 6–23)
CO2: 30 mEq/L (ref 19–32)
Calcium: 9.3 mg/dL (ref 8.4–10.5)
Chloride: 104 mEq/L (ref 96–112)
Creatinine, Ser: 1.16 mg/dL (ref 0.40–1.50)
GFR: 68.4 mL/min (ref >60.00)
Glucose, Bld: 95 mg/dL (ref 70–99)
Potassium: 4.8 mEq/L (ref 3.5–5.1)
Sodium: 140 mEq/L (ref 135–145)
Total Bilirubin: 0.5 mg/dL (ref 0.2–1.2)
Total Protein: 6.8 g/dL (ref 6.0–8.3)

## 2022-07-01 NOTE — Progress Notes (Signed)
Phone: [phone]   Subjective:  Patient presents today for their annual physical. Chief complaint-noted.   See problem oriented charting- ROS- full  review of systems was completed and negative  except for: seasonal allergies, some aches and pains.   The following were reviewed and entered/updated in epic: Past Medical History:  Diagnosis Date   Adenomatous colon polyp    2013, 5 year repeat   Allergy    Coronary artery disease    Diverticulosis    HTN (hypertension)    Hyperlipidemia    no rx. total just above 200, HDL great, LDL in 120s 130s   Internal hemorrhoids    Leukopenia    stable in the 3s   Migraine    relpax about once a month   Patient Active Problem List   Diagnosis Date Noted   Thoracic aortic aneurysm ([place]) 09/04/2020    Priority: High   Aortic atherosclerosis ([place]) 03/29/2021    Priority: Medium    BPPV (benign paroxysmal positional vertigo), left 12/28/2019    Priority: Medium    Essential hypertension 08/28/2019    Priority: Medium    Allergic rhinitis 06/16/2017    Priority: Medium    Migraine     Priority: Medium    Hyperlipidemia     Priority: Medium    Adenomatous colon polyp     Priority: Low   Leukopenia     Priority: Low   Medial epicondylitis of elbow, right 07/16/2019    Priority: 1.   Past Surgical History:  Procedure Laterality Date   ANAL FISSURE REPAIR  2007   COLONOSCOPY W/ POLYPECTOMY     HERNIA REPAIR     as infant; bilateral   right elbow surgery     VASECTOMY      Family History  Problem Relation Age of Onset   Lung cancer Mother        smoker, died 22 age 85   Heart failure Father        rheumatic fever-died 23 months after mother, did not take care of himself   Mitral valve prolapse Father    Colon cancer Maternal Grandfather        early 31s   Stomach cancer Neg Hx     Medications- reviewed and updated Current Outpatient Medications  Medication Sig Dispense Refill   ezetimibe (ZETIA) 10 MG tablet  TAKE 1 TABLET BY MOUTH EVERY DAY 90 tablet 2   fexofenadine (ALLEGRA) 180 MG tablet Take 180 mg by mouth daily.     Omega-3 Fatty Acids (FISH OIL) 1200 MG CAPS Take by mouth.     pantoprazole (PROTONIX) 20 MG tablet Take 1 tablet (20 mg total) by mouth daily. 90 tablet 3   rosuvastatin (CRESTOR) 10 MG tablet Take 1 tablet (10 mg total) by mouth daily. (Patient taking differently: Take 10 mg by mouth 2 (two) times a week.) 90 tablet 3   valsartan (DIOVAN) 80 MG tablet Take 1 tablet (80 mg total) by mouth daily. 90 tablet 3   No current facility-administered medications for this visit.    Allergies-reviewed and updated No Known Allergies  Social History   Social History Narrative   Married (wife patient of Dr. Yong Channel), 2 daughters Janett Billow NP (in [place] with palliative care, had been in [place] with oncology) and Lilia Pro  ([employer] grad going into insurance in [place] 2019) .   - 1 granddaughter 53 months old in 2023.          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    Prior Worked at [employer]: running, biking, golf   -right handed   Objective  Objective:  BP 104/70   Pulse (!) 54   Temp 98.2 F (36.8 C)   Ht '5\' 10"'$  (1.778 m)   Wt 179 lb 6.4 oz (81.4 kg)   SpO2 96%   BMI 25.74 kg/m  Gen: NAD, resting comfortably HEENT: Mucous membranes are moist. Oropharynx normal Neck: no thyromegaly CV: RRR no murmurs rubs or gallops Lungs: CTAB no crackles, wheeze, rhonchi Abdomen: soft/nontender/nondistended/normal bowel sounds. No rebound or guarding.  Ext: no edema Skin: warm, dry Neuro: grossly normal, moves all extremities, PERRLA   Assessment and Plan  61 y.o. male presenting for annual physical.  Health Maintenance counseling: 1. Anticipatory guidance: Patient counseled regarding regular dental exams -q6 months, eye  exams -twice yearly- glaucoma suspect,  avoiding smoking and second hand smoke , limiting alcohol to 2 beverages per day- 7 drinks per week, no illicit drugs .   2. Risk factor reduction:  Advised patient of need for regular exercise and diet rich and fruits and vegetables to reduce risk of heart attack and stroke.  Exercise- still getting 60 miles a month- about 700 a year! Also does strength training.  Diet/weight management-stable weight- reasonably healthy diet- around 175 at home.  Wt Readings from Last 3 Encounters:  07/01/22 179 lb 6.4 oz (81.4 kg)  03/30/22 178 lb (80.7 kg)  11/27/21 177 lb 3.2 oz (80.4 kg)  3. Immunizations/screenings/ancillary studies- hold off on RSV Immunization History  Administered Date(s) Administered   Covid-19, Mrna,Vaccine(Spikevax)33yr and older 05/03/2022   Influenza Inj Mdck Quad Pf 04/25/2019, 05/03/2022   Influenza Split 04/10/2012, 03/28/2013   Influenza,inj,Quad PF,6+ Mos 05/09/2015, 03/18/2016, 05/18/2018, 05/18/2019, 04/21/2021   Influenza-Unspecified 04/18/2013, 05/13/2014, 04/11/2017, 05/18/2018, 04/23/2020   PFIZER(Purple Top)SARS-COV-2 Vaccination 10/04/2019, 10/29/2019, 05/22/2020   Pfizer Covid-19 Vaccine Bivalent Booster 129yr& up 06/21/2021   Td 07/28/2007   Tdap 06/19/2018   Zoster Recombinat (Shingrix) 06/19/2018, 09/28/2018  4. Prostate cancer screening-   low risk prior PSA trend-continue to trend labs today.  Defer rectal mass concerning trend  Lab Results  Component Value Date   PSA 0.54 06/25/2020   PSA 0.53 06/21/2019   PSA 0.52 06/19/2018   5. Colon cancer screening -  history of adenomatous polyp of the colon but not on last colonoscopy 05/19/2017 - 10-year repeat recommended  6. Skin cancer screening- follows with Dr. GoDelman Cheadle1-2 times a year depending on findings- mohs in the past. Advised regular sunscreen use. Denies worrisome, changing, or new skin lesions.  7. Smoking associated screening (lung cancer screening, AAA  screen 65-75, UA)- Never smoker  8. STD screening - opts out as monogamous with wife   Status of chronic or acute concerns   # aortic aneurysm thoracic S: Incidental finding on imaging for coronary calcium scoring at 42 mm in January 2022.  Stable at 40 mm September 2022. Repeat with Dr. BaCyndia Bent/9/24.  A/P: stable- no medication  -Systolic blood pressure goal 105-1 20 -avoid quiniolone antibiotics if possible  #hypertension-more aggressive blood pressure goal due to potential aneurysm S: medication: valsartan '80mg'$  -prior amlodipine 10 mg but bp lower A/P: stable- continue current medicines   #hyperlipidemia with coronary calcium score of 68 but focus of calcium  in proximal LAD.  LDL before starting medication 136 S: Medication:Rosuvastatin 10 mg twice a week, zetia 10 mg daily Lab Results  Component Value Date   CHOL 158 11/27/2021   HDL 80 11/27/2021   LDLCALC 70 11/27/2021   LDLDIRECT 132.8 05/23/2013   TRIG 30 11/27/2021   CHOLHDL 2.0 11/27/2021  A/P: reasonable control- cardiology was even ok with goal LDL of 100- but he was even better than that at 70- continue current medications   #BPPV starting 2021- referred to [facility]ibular rehab in January 2022- reports very helpful- no recurrent issues  #Left hydronephrosis- dilated left renal pelvis- Dr. Jeffie Pollock [place] renal imaging flow From 07/06/21 ""Normal RIGHT renogram.   Abnormal tracer retention within a marked central collection of the LEFT kidney consistent with a markedly dilated renal pelvis.   Failure of LEFT kidney to clear tracer despite Lasix administration consistent with urinary outflow obstruction, likely UPJ obstruction due to lack of LEFT ureteral visualization"  - in note mentioned as congenital and 1 year follow up from 08/2021  #GERD- has worked with Dr. Hilarie Fredrickson- and able ot get PPI down to 20 mg in 2023- tried to go off medicine and caused issues even with Pepcid  Recommended follow up: Return in about 1 year  (around 07/02/2023) for physical or sooner if needed.Schedule b4 you leave. Future Appointments  Date Time Provider [place]  08/27/2022  9:00 AM GI-315 CT 1 GI-315CT GI-[address]  08/30/2022  1:00 PM TCTS-CAR [place] TCTS-CARGSO TCTSG  11/17/2022  9:00 AM DWB-ECHO/VAS DW[facility]-CVIMG DWB   Lab/Order associations: fasting   ICD-10-CM   1. Preventative health care  Z00.00     2. Hyperlipidemia, unspecified hyperlipidemia type  E78.5     3. Screening for prostate cancer  Z12.5       No orders of the defined types were placed in this encounter.   Return precautions advised.  Garret Reddish, MD

## 2022-07-01 NOTE — Patient Instructions (Addendum)
Please stop by lab before you go If you have mychart- we will send your results within 3 business days of [place] receiving them.  If you do not have mychart- we will call you about results within 5 business days of [place] receiving them.  *please also note that you will see labs on mychart as soon as they post. I will later go in and write notes on them- will say "notes from Dr. Yong Channel"   No changes today unless labs lead [place] to make changes  Recommended follow up: Return in about 1 year (around 07/02/2023) for physical or sooner if needed.Schedule b4 you leave.

## 2022-07-14 ENCOUNTER — Other Ambulatory Visit: Payer: Self-pay | Admitting: Family Medicine

## 2022-08-25 NOTE — Progress Notes (Addendum)
PCP is Marin Olp, MD Referring Provider is Marin Olp, MD  Chief Complaint: Ascending thoracic aortic aneurysm   HPI: This is a 62 year old male with a past medical history of hypertension, hyperlipidemia, CAD, leukopenia who was incidentally found on imaging for coronary calcium scoring to have an ascending thoracic aortic aneurysm at 42 mm in January 2022. He was last seen in October 2022 and the ATAA at that time measured 40 mm. He was seen by Dr. Audie Box (Cardiology) May 2023 who recommended another echo. Patient denies chest pain, pressure, tightness, LE edema, or shortness of breath. He works out and [employer] (not heavy) three times per week.  Past Medical History:  Diagnosis Date   Adenomatous colon polyp    2013, 5 year repeat   Allergy    Coronary artery disease    Diverticulosis    HTN (hypertension)    Hyperlipidemia    no rx. total just above 200, HDL great, LDL in 120s 130s   Internal hemorrhoids    Leukopenia    stable in the 3s   Migraine    relpax about once a month    Past Surgical History:  Procedure Laterality Date   ANAL FISSURE REPAIR  2007   COLONOSCOPY W/ POLYPECTOMY     HERNIA REPAIR     as infant; bilateral   right elbow surgery     VASECTOMY      Family History  Problem Relation Age of Onset   Lung cancer Mother        smoker, died 55 age 20   Heart failure Father        rheumatic fever-died 23 months after mother, did not take care of himself   Mitral valve prolapse Father    Colon cancer Maternal Grandfather        early 29s   Stomach cancer Neg Hx     Social History Social History   Tobacco Use   Smoking status: Never   Smokeless tobacco: Never  Substance Use Topics   Alcohol use: Yes    Alcohol/week: 7.0 standard drinks of alcohol    Types: 7 Standard drinks or equivalent per week   Drug use: No    Current Outpatient Medications  Medication Sig Dispense Refill   ezetimibe (ZETIA) 10 MG tablet TAKE 1  TABLET BY MOUTH EVERY DAY 90 tablet 2   fexofenadine (ALLEGRA) 180 MG tablet Take 180 mg by mouth daily.     Omega-3 Fatty Acids (FISH OIL) 1200 MG CAPS Take by mouth.     pantoprazole (PROTONIX) 20 MG tablet Take 1 tablet (20 mg total) by mouth daily. 90 tablet 3   rosuvastatin (CRESTOR) 10 MG tablet Take 1 tablet (10 mg total) by mouth daily. (Patient taking differently: Take 10 mg by mouth 2 (two) times a week.) 90 tablet 3   valsartan (DIOVAN) 80 MG tablet TAKE 1 TABLET BY MOUTH EVERY DAY 90 tablet 3   Allergies: No Known Allergies  Review of Systems:  Chest Pain [ N ] Resting SOB Aqua.Slicker ] Exertional SOB [  N]  Pedal Edema [ N ] Syncope [  N]  General Review of Systems: [Y] = yes [ N]=no  Consitutional:   nausea Aqua.Slicker ];  fever [ N];  Eye : blurred vision Aqua.Slicker ]; Amaurosis fugax[ N ];  Resp: cough Aqua.Slicker ];  hemoptysis[N ];  GI: vomiting[ N]; melena[ N]; hematochezia [N];  [id number] ]; Heme/Lymph: anemia[N ];  Neuro: TIA[  N];stroke[ N];  seizures[ N];  Endocrine: diabetes[N ];   Vital Signs: Vitals:   08/31/22 1258  BP: 112/66  Pulse: (!) 55  Resp: 20  SpO2: 96%     Physical Exam: CV-Slightly bradycardic, no murmur Pulmonary-Clear to auscultation bilaterally Neck-No carotid bruit Abdomen-Soft, non tender, bowel sounds present Extremities-No LE edema Neurologic-Grossly intact without focal deficits  Diagnostic Tests:  Narrative & Impression  CLINICAL DATA:  Thoracic aortic aneurysm surveillance   EXAM: CT ANGIOGRAPHY CHEST WITH CONTRAST   TECHNIQUE: Multidetector CT imaging of the chest was performed using the standard protocol during bolus administration of intravenous contrast. Multiplanar CT image reconstructions and MIPs were obtained to evaluate the vascular anatomy.   RADIATION DOSE REDUCTION: This exam was performed according to the departmental dose-optimization program which includes automated exposure control, adjustment of the mA and/or kV according  to patient size and/or use of iterative reconstruction technique.   CONTRAST:  56m ISOVUE-370 IOPAMIDOL (ISOVUE-370) INJECTION 76%   COMPARISON:  03/27/2021.   FINDINGS: Cardiovascular: Thoracic aortic aneurysm observed. Transverse measurements:   Sinotubular junction: 35 mm, stable   Mid ascending aorta: 40 mm, stable   Proximal aortic arch 38 mm, stable   Mid aortic arch: 30 mm, stable   Posterior arch: 29 mm, stable   Mid descending thoracic aorta: 26 mm, stable   Hiatus: 25 mm, stable   No dissection or acute aortic findings.   Contrast medium timing in the pulmonary arteries is not optimized to assess for pulmonary embolus, but is satisfactory to indicate that there is no large or central pulmonary embolus.   There is some mild atheromatous plaque of the left subclavian artery near the takeoff of the left vertebral artery. Punctate calcification proximally in the left anterior descending coronary artery.   Mediastinum/Nodes: Unremarkable   Lungs/Pleura: Mild biapical pleuroparenchymal scarring.   Upper Abdomen: Known Bosniak category 2 cyst of the right kidney upper pole has worked up on prior abdominal MRI from 04/09/2021. No further imaging workup of this lesion is indicated.   Known substantially dilated left renal collecting system as worked up on prior MRI of 04/09/2021, unchanged.   Musculoskeletal: Unremarkable   Review of the MIP images confirms the above findings.   IMPRESSION: 1. Stable mild fusiform dilatation of the ascending thoracic aorta at 40 mm. Recommend annual imaging followup by CTA or MRA. This recommendation follows 2010 ACCF/AHA/AATS/ACR/ASA/SCA/SCAI/SIR/STS/SVM Guidelines for the Diagnosis and Management of Patients with Thoracic Aortic Disease. Circulation. 2010; 121:[id number] Aortic aneurysm NOS (ICD10-I71.9) 2. Mild calcification in the left anterior descending coronary artery proximally. 3. Known substantially dilated  left renal collecting system as worked up on prior MRI of 04/09/2021, unchanged. 4. Mild atheromatous plaque in the left anterior descending coronary artery and left subclavian artery. 5. Known Bosniak category 2 cyst of the right kidney upper pole has worked up on prior abdominal MRI from 04/09/2021. No further imaging workup of this lesion is indicated.     Electronically Signed   By: WVan ClinesM.D.   On: 08/27/2022 09:44    Impression and Plan: CTA done 08/27/2022 showed a 4 cm (40 mm) ascending thoracic aortic aneurysm.   We discussed there is no need for surgical intervention at this time;only continued surveillance. We discussed the natural history and and risk factors for growth of ascending aortic aneurysms.  We covered the importance of smoking cessation, tight blood pressure control, refraining from lifting heavy objects, and avoiding fluoroquinolones.  We will continue surveillance and a repeat  CTA was ordered for one year.     Nani Skillern, PA-C [facility] [phone]

## 2022-08-27 ENCOUNTER — Ambulatory Visit
Admission: RE | Admit: 2022-08-27 | Discharge: 2022-08-27 | Disposition: A | Discharge status: Home / Self Care | Payer: BC Managed Care – PPO | Source: Ambulatory Visit | Attending: Surgery | Admitting: Surgery

## 2022-08-27 DIAGNOSIS — I2510 Atherosclerotic heart disease of native coronary artery without angina pectoris: Secondary | ICD-10-CM | POA: Diagnosis not present

## 2022-08-27 DIAGNOSIS — I712 Thoracic aortic aneurysm, without rupture, unspecified: Secondary | ICD-10-CM | POA: Diagnosis not present

## 2022-08-27 DIAGNOSIS — I359 Nonrheumatic aortic valve disorder, unspecified: Secondary | ICD-10-CM | POA: Diagnosis not present

## 2022-08-27 DIAGNOSIS — J984 Other disorders of lung: Secondary | ICD-10-CM | POA: Diagnosis not present

## 2022-08-27 MED ORDER — IOPAMIDOL (ISOVUE-370) INJECTION 76%
75.0000 mL | Freq: Once | INTRAVENOUS | Status: AC | PRN
  Administered 2022-08-27: 75 mL via INTRAVENOUS

## 2022-08-30 ENCOUNTER — Ambulatory Visit: Payer: BC Managed Care – PPO

## 2022-08-31 ENCOUNTER — Encounter: Payer: Self-pay | Admitting: Physician Assistant

## 2022-08-31 ENCOUNTER — Ambulatory Visit (INDEPENDENT_AMBULATORY_CARE_PROVIDER_SITE_OTHER): Payer: BC Managed Care – PPO | Admitting: Physician Assistant

## 2022-08-31 VITALS — BP 112/66 | HR 55 | Resp 20 | Ht 70.0 in | Wt 179.0 lb

## 2022-08-31 DIAGNOSIS — I7121 Aneurysm of the ascending aorta, without rupture: Secondary | ICD-10-CM

## 2022-08-31 NOTE — Patient Instructions (Addendum)
Risk Modification in those with ascending thoracic aortic aneurysm:  Continue good control of blood pressure (prefer SBP 130/80 or less)-continue Valsartan One should consider eating a heart healthy, low salt diet  2. Avoid fluoroquinolone antibiotics (I.e Ciprofloxacin, Avelox, Levofloxacin, Ofloxacin)  3.  Use of statin (to decrease cardiovascular risk)-continue Zetia and Crestor  4.  Exercise and activity limitations is individualized, but in general, contact sports are to be  avoided and one should avoid heavy lifting (defined as half of ideal body weight) and exercises involving sustained Valsalva maneuver.  5. Counseling for those suspected of having genetically mediated disease. First-degree relatives of those with TAA disease should be screened as well as those who have a connective tissue disease (I.e with Marfan syndrome, Ehlers-Danlos syndrome,  and Loeys-Dietz syndrome) or a  bicuspid aortic valve,have an increased risk for  complications related to TAA.Aforementioned does not apply. Echo done December 2022 showed aortic valve to be tricuspid, no AI or AS, trivial MR  6. He does not have a history of tobacco abuse.

## 2022-09-02 DIAGNOSIS — Q6211 Congenital occlusion of ureteropelvic junction: Secondary | ICD-10-CM | POA: Diagnosis not present

## 2022-09-20 ENCOUNTER — Encounter: Payer: Self-pay | Admitting: Family Medicine

## 2022-10-01 ENCOUNTER — Encounter: Payer: Self-pay | Admitting: Family Medicine

## 2022-10-01 ENCOUNTER — Ambulatory Visit (INDEPENDENT_AMBULATORY_CARE_PROVIDER_SITE_OTHER): Payer: BC Managed Care – PPO | Admitting: Family Medicine

## 2022-10-01 VITALS — BP 118/78 | HR 62 | Temp 98.0°F | Ht 70.0 in | Wt 184.0 lb

## 2022-10-01 DIAGNOSIS — E785 Hyperlipidemia, unspecified: Secondary | ICD-10-CM | POA: Diagnosis not present

## 2022-10-01 DIAGNOSIS — R0981 Nasal congestion: Secondary | ICD-10-CM | POA: Diagnosis not present

## 2022-10-01 DIAGNOSIS — I10 Essential (primary) hypertension: Secondary | ICD-10-CM

## 2022-10-01 LAB — POC COVID19 BINAXNOW: SARS Coronavirus 2 Ag: NEGATIVE

## 2022-10-01 NOTE — Patient Instructions (Addendum)
-  is doing 3 sixteen oz containers of green tea per day- plus 3-4 cups of coffee per day- we discussed this is A LOT of caffeine/stimulants- would love to see him reduce perhaps 50% to reduce overall caffeine   sinus congestion/pressure- likely viral sinusitis- we discussed if symptoms last over 10 days or if he worsens at this point to let us know- would use augmentin for 7 days to treat in that case.    we discussed [facility]house- likely limit to 3 months of use just because we do not have data on ashwagandha for safety beyond that interval - slight liver risk  Recommended follow up: Return for as needed for new, worsening, persistent symptoms.

## 2022-10-01 NOTE — Progress Notes (Signed)
Phone [phone] In person visit   Subjective:   Matthew Fox is a 62 y.o. year old very pleasant male patient who presents for/with See problem oriented charting Chief Complaint  Patient presents with   supplements    Pt is her to discuss [facility] [place] supplements   sinus congestion    Pt c/o sinus congestion and drainage with brown mucous that started Monday pt COVID tested Weds and was negative.    Past Medical History-  Patient Active Problem List   Diagnosis Date Noted   Thoracic aortic aneurysm ([place]) 09/04/2020    Priority: High   Aortic atherosclerosis ([place]) 03/29/2021    Priority: Medium    BPPV (benign paroxysmal positional vertigo), left 12/28/2019    Priority: Medium    Essential hypertension 08/28/2019    Priority: Medium    Allergic rhinitis 06/16/2017    Priority: Medium    Migraine     Priority: Medium    Hyperlipidemia     Priority: Medium    Adenomatous colon polyp     Priority: Low   Leukopenia     Priority: Low   Medial epicondylitis of elbow, right 07/16/2019    Priority: 1.    Medications- reviewed and updated Current Outpatient Medications  Medication Sig Dispense Refill   co-enzyme Q-10 30 MG capsule Take 30 mg by mouth 3 (three) times daily.     ezetimibe (ZETIA) 10 MG tablet TAKE 1 TABLET BY MOUTH EVERY DAY 90 tablet 2   fexofenadine (ALLEGRA) 180 MG tablet Take 180 mg by mouth daily.     pantoprazole (PROTONIX) 20 MG tablet Take 1 tablet (20 mg total) by mouth daily. 90 tablet 3   rosuvastatin (CRESTOR) 10 MG tablet Take 1 tablet (10 mg total) by mouth daily. (Patient taking differently: Take 10 mg by mouth 2 (two) times a week.) 90 tablet 3   valsartan (DIOVAN) 80 MG tablet TAKE 1 TABLET BY MOUTH EVERY DAY 90 tablet 3   No current facility-administered medications for this visit.     Objective:  BP 118/78   Pulse 62   Temp 98 F (36.7 C)   Ht 5\' 10"  (1.778 m)   Wt 184 lb (83.5 kg)   SpO2 99%   BMI 26.40 kg/m  Gen: NAD,  resting comfortably Tympanic membrane normal, nasal turbinates erythematous with some clear drainage, bilateral sinus pressure maxillary and frontal noted CV: RRR no murmurs rubs or gallops Lungs: CTAB no crackles, wheeze, rhonchi  Results for orders placed or performed in visit on 10/01/22 (from the past 24 hour(s))  POC COVID-19     Status: None   Collection Time: 10/01/22  9:04 AM  Result Value Ref Range   SARS Coronavirus 2 Ag Negative Negative        Assessment and Plan   # Sinus congestion S: Patient started with sinus congestion on Monday-notes brown mucus/drainage.  He has tested negative for COVID on Tuesday night of this week.had seen granddaughter last weekend. Started with scratchy throat, then mild cough, worsening congestion.  -starting to feel slightly better today - feels starting to turn corner -mucinex diabetes with some help - No fever, chills, shortness of breath, wheezing  A/P: sinus congestion/pressure- likely viral sinusitis- we discussed if symptoms last over 10 days or if he worsens at this point to let us know- would use augmentin for 7 days to treat in that case.    # Supplement discussion S: Patient is interested in the medication called  [facility] supplements- including ashwagandha -is doing 3 sixteen oz containers of green tea per day- plus 3-4 cups of coffee per day- we discussed this is A LOT of caffeine/stimulants- would love to see him reduce perhaps 50% to reduce overall caffeine A/P: we discussed roundhouse- likely limit to 3 months of use just because we do not have data on ashwagandha for safety beyond that interval - slight liver risk   #hypertension-more aggressive blood pressure goal due to potential aneurysm S: medication: valsartan 80mg  BP Readings from Last 3 Encounters:  10/01/22 118/78  08/31/22 112/66  07/01/22 104/70  A/P: stable- continue current medicines   #hyperlipidemia with coronary calcium score of 68 but focus of calcium in  proximal LAD.  LDL before starting medication 136 S: Medication: Rosuvastatin 10 mg Lab Results  Component Value Date   CHOL 167 07/01/2022   HDL 69.90 07/01/2022   LDLCALC 88 07/01/2022   LDLDIRECT 132.8 05/23/2013   TRIG 49.0 07/01/2022   CHOLHDL 2 07/01/2022   A/P: close to ideal control- continue current medications and focus on lifestyle- with ashwaganda having some liver risk- in particular discussed 3 month limit on use  Recommended follow up: Return for as needed for new, worsening, persistent symptoms. Future Appointments  Date Time Provider [place]  11/17/2022  9:00 AM [facility]-ECHO/VAS [facility]  07/06/2023  8:00 AM Marin Olp, MD [facility]   Lab/Order associations:   ICD-10-CM   1. Sinus congestion  R09.81 POC COVID-19    2. Essential hypertension  I10     3. Hyperlipidemia, unspecified hyperlipidemia type  E78.5      Return precautions advised.  Garret Reddish, MD

## 2022-10-04 ENCOUNTER — Encounter: Payer: Self-pay | Admitting: Family Medicine

## 2022-10-04 MED ORDER — AMOXICILLIN-POT CLAVULANATE 875-125 MG PO TABS: 1.0000 | ORAL_TABLET | Freq: Two times a day (BID) | ORAL | 0 refills | Status: AC

## 2022-10-04 MED ORDER — BENZONATATE 100 MG PO CAPS: 100.0000 mg | ORAL_CAPSULE | Freq: Two times a day (BID) | ORAL | 0 refills | Status: DC | PRN

## 2022-11-17 ENCOUNTER — Ambulatory Visit (INDEPENDENT_AMBULATORY_CARE_PROVIDER_SITE_OTHER): Payer: BC Managed Care – PPO

## 2022-11-17 DIAGNOSIS — I77810 Thoracic aortic ectasia: Secondary | ICD-10-CM

## 2022-11-17 LAB — ECHOCARDIOGRAM COMPLETE
Area-P 1/2: 2.83 cm2
S' Lateral: 3.48 cm

## 2023-01-07 ENCOUNTER — Other Ambulatory Visit: Payer: Self-pay | Admitting: Cardiovascular Disease

## 2023-02-03 ENCOUNTER — Other Ambulatory Visit: Payer: Self-pay | Admitting: Cardiovascular Disease

## 2023-03-07 NOTE — Progress Notes (Unsigned)
Cardiology Office Note:   Date:  03/08/2023  NAME:  Matthew Fox    MRN: [id number] DOB:  February 24, 1961   PCP:  Shelva Majestic, MD  Cardiologist:  Reatha Harps, MD  Electrophysiologist:  None   Referring MD: Shelva Majestic, MD   Chief Complaint  Patient presents with   Follow-up         History of Present Illness:   Matthew Fox is a 62 y.o. male with a hx of coronary calcium, aorta dilation who presents for follow-up.  Reports no chest pain.  He is exercising often.  Lipids are controlled.  Taking Crestor twice per week.  On Zetia.  Given his calcium score of 68 in the 52nd percentile I believe an LDL less than 100 is acceptable.  Ascending aorta remains 40 mm.  We discussed this is stable over 2 years and likely to not be an issue for him in life.  Would recommend possibly testing this in 2 more years but I do not think this is entirely necessary.  His blood pressure is well-controlled.  Overall doing well.  Still working as a Firefighter.  Playing golf as well.  Problem List Coronary calcium -CAC score 68 (52nd percentile) 2. Ascending aortic dilation  -03/2021 40 mm  -11/2022 40 mm  3. HLD -T chol 167, HDL 69, LDL 88, TG 49  Past Medical History: Past Medical History:  Diagnosis Date   Adenomatous colon polyp    2013, 5 year repeat   Allergy    Coronary artery disease    Diverticulosis    HTN (hypertension)    Hyperlipidemia    no rx. total just above 200, HDL great, LDL in 120s 130s   Internal hemorrhoids    Leukopenia    stable in the 3s   Migraine    relpax about once a month    Past Surgical History: Past Surgical History:  Procedure Laterality Date   ANAL FISSURE REPAIR  2007   COLONOSCOPY W/ POLYPECTOMY     HERNIA REPAIR     as infant; bilateral   right elbow surgery     VASECTOMY      Current Medications: Current Meds  Medication Sig   benzonatate (TESSALON) 100 MG capsule Take 1 capsule (100 mg total) by mouth 2 (two)  times daily as needed for cough.   co-enzyme Q-10 30 MG capsule Take 30 mg by mouth 3 (three) times daily.   ezetimibe (ZETIA) 10 MG tablet TAKE 1 TABLET BY MOUTH EVERY DAY   fexofenadine (ALLEGRA) 180 MG tablet Take 180 mg by mouth daily.   pantoprazole (PROTONIX) 20 MG tablet Take 1 tablet (20 mg total) by mouth daily.   rosuvastatin (CRESTOR) 10 MG tablet Take 1 tablet (10 mg total) by mouth daily. (Patient taking differently: Take 10 mg by mouth 2 (two) times a week.)   valsartan (DIOVAN) 80 MG tablet TAKE 1 TABLET BY MOUTH EVERY DAY     Allergies:    Patient has no known allergies.   Social History: Social History   Socioeconomic History   Marital status: Married    Spouse name: Not on file   Number of children: 2   Years of education: Not on file   Highest education level: Not on file  Occupational History   Occupation: Radiation protection practitioner  Tobacco Use   Smoking status: Never   Smokeless tobacco: Never  Substance and Sexual Activity   Alcohol use: Yes  Alcohol/week: 7.0 standard drinks of alcohol    Types: 7 Standard drinks or equivalent per week   Drug use: No   Sexual activity: Not on file  Other Topics Concern   Not on file  Social History Narrative   Married (wife patient of Dr. Durene Cal), 2 daughters Shanda Bumps NP (in [place] with palliative care, had been in [place] with oncology) and Lequita Halt  ([employer] grad going into insurance in [place] 2019) .   - 1 granddaughter 67 months old in 2023.          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    Prior Worked at [employer]: running, biking, golf   -right handed   Social Determinants of Corporate investment banker Strain: Not on file  Food Insecurity: Not on file  Transportation Needs: Not on file  Physical Activity: Not on file  Stress: Not on file  Social  Connections: Not on file     Family History: The patient's family history includes Colon cancer in his maternal grandfather; Heart failure in his father; Lung cancer in his mother; Mitral valve prolapse in his father. There is no history of Stomach cancer.  ROS:   All other ROS reviewed and negative. Pertinent positives noted in the HPI.     EKGs/Labs/Other Studies Reviewed:   The following studies were personally reviewed by me today:  EKG:  EKG is ordered today.    EKG Interpretation Date/Time:  Tuesday March 08 2023 15:28:20 EDT Ventricular Rate:  47 PR Interval:  174 QRS Duration:  114 QT Interval:  440 QTC Calculation: 389 R Axis:   62  Text Interpretation: Sinus bradycardia Confirmed by Lennie Odor ([id number]) on 03/08/2023 3:30:00 PM   Recent Labs: 07/01/2022: ALT 16; BUN 18; Creatinine, Ser 1.16; Hemoglobin 14.7; Platelets 176.0; Potassium 4.8; Sodium 140   Recent Lipid Panel    Component Value Date/Time   CHOL 167 07/01/2022 0850   CHOL 158 11/27/2021 1047   TRIG 49.0 07/01/2022 0850   HDL 69.90 07/01/2022 0850   HDL 80 11/27/2021 1047   CHOLHDL 2 07/01/2022 0850   VLDL 9.8 07/01/2022 0850   LDLCALC 88 07/01/2022 0850   LDLCALC 70 11/27/2021 1047   LDLCALC 136 (H) 06/25/2020 0900   LDLDIRECT 132.8 05/23/2013 0837    Physical Exam:   VS:  BP 132/78 (BP Location: Left Arm, Patient Position: Sitting, Cuff Size: Normal)   Pulse (!) 47   Ht 5\' 10"  (1.778 m)   Wt 184 lb 6.4 oz (83.6 kg)   SpO2 95%   BMI 26.46 kg/m    Wt Readings from Last 3 Encounters:  03/08/23 184 lb 6.4 oz (83.6 kg)  10/01/22 184 lb (83.5 kg)  08/31/22 179 lb (81.2 kg)    General: Well nourished, well developed, in no acute distress Head: Atraumatic, normal size  Eyes: PEERLA, EOMI  Neck: Supple, no JVD Endocrine: No thryomegaly Cardiac: Normal S1, S2; RRR; no murmurs, rubs, or gallops Lungs: Clear to auscultation bilaterally, no wheezing, rhonchi or rales  Abd: Soft, nontender, no  hepatomegaly  Ext: No edema, pulses 2+ Musculoskeletal: No deformities, BUE and BLE strength normal and equal Skin: Warm and dry, no rashes   Neuro: Alert and oriented to person, place, time, and situation, CNII-XII grossly intact, no focal deficits  Psych: Normal mood and affect   ASSESSMENT:   Matthew Fox is a 62 y.o. male who presents for the following: 1. Agatston coronary artery calcium score less than 100   2. Mixed hyperlipidemia   3. Ascending aorta dilatation (HCC)     PLAN:   1. Agatston coronary artery calcium score less than 100 2. Mixed hyperlipidemia -Coronary calcium score less than 100.  Value less than 75th percentile.  Taking Crestor 10 mg twice per week.  On Zetia.  I think as long as his LDL is less than 100 this is okay.  No symptoms of angina.  He has high HDL cholesterol which is protective.  Would recommend to continue current therapy.  No changes.  As long as he is stable without chest pain he can see [place] back as needed.  His primary care physician is doing a good job with his lipids and other medical issues.  3. Ascending aorta dilatation (HCC) -40 mm.  Stable over 2-year.  We discussed this is likely normal for his age.  I have recommended no further testing.  Suspect this will not be an issue for him in life.     Disposition: Return if symptoms worsen or fail to improve.  Medication Adjustments/Labs and Tests Ordered: Current medicines are reviewed at length with the patient today.  Concerns regarding medicines are outlined above.  Orders Placed This Encounter  Procedures   EKG 12-Lead   No orders of the defined types were placed in this encounter.  Patient Instructions  Medication Instructions:  The current medical regimen is effective;  continue present plan and medications as directed. Please refer to the Current Medication list given to you today.  *If you need a refill on your cardiac medications before your next appointment, please call  your pharmacy*  Lab Work: NONE If you have labs (blood work) drawn today and your tests are completely normal, you will receive your results only by: MyChart Message (if you have MyChart) OR A paper copy in the mail If you have any lab test that is abnormal or we need to change your treatment, we will call you to review the results.  Testing/Procedures: NONE  Follow-Up: At [facility], you and your health needs are our priority.  As part of our continuing mission to provide you with exceptional heart care, we have created designated Provider Care Teams.  These Care Teams include your primary Cardiologist (physician) and Advanced Practice Providers (APPs -  Physician Assistants and Nurse Practitioners) who all work together to provide you with the care you need, when you need it.  Your next appointment:   AS NEEDED   Provider:   Reatha Harps, MD     Other Instructions    Time Spent with Patient: I have spent a total of 25 minutes with patient reviewing hospital notes, telemetry, EKGs, labs and examining the patient as well as establishing an assessment and plan that was discussed with the patient.  > 50% of time was spent in direct patient care.  Signed, Lenna Gilford. Flora Lipps, MD, [facility]  [facility]  [address] [place], [place] [address] [phone]  03/08/2023 8:29 PM

## 2023-03-08 ENCOUNTER — Encounter: Payer: Self-pay | Admitting: Cardiovascular Disease

## 2023-03-08 ENCOUNTER — Ambulatory Visit: Payer: BC Managed Care – PPO | Admitting: Cardiovascular Disease

## 2023-03-08 VITALS — BP 132/78 | HR 47 | Ht 70.0 in | Wt 184.4 lb

## 2023-03-08 DIAGNOSIS — R931 Abnormal findings on diagnostic imaging of heart and coronary circulation: Secondary | ICD-10-CM

## 2023-03-08 DIAGNOSIS — E782 Mixed hyperlipidemia: Secondary | ICD-10-CM

## 2023-03-08 DIAGNOSIS — I77810 Thoracic aortic ectasia: Secondary | ICD-10-CM

## 2023-03-08 NOTE — Patient Instructions (Addendum)
Medication Instructions:  The current medical regimen is effective;  continue present plan and medications as directed. Please refer to the Current Medication list given to you today.  *If you need a refill on your cardiac medications before your next appointment, please call your pharmacy*  Lab Work: NONE If you have labs (blood work) drawn today and your tests are completely normal, you will receive your results only by: MyChart Message (if you have MyChart) OR A paper copy in the mail If you have any lab test that is abnormal or we need to change your treatment, we will call you to review the results.  Testing/Procedures: NONE  Follow-Up: At [facility], you and your health needs are our priority.  As part of our continuing mission to provide you with exceptional heart care, we have created designated Provider Care Teams.  These Care Teams include your primary Cardiologist (physician) and Advanced Practice Providers (APPs -  Physician Assistants and Nurse Practitioners) who all work together to provide you with the care you need, when you need it.  Your next appointment:   AS NEEDED   Provider:   Reatha Harps, MD     Other Instructions

## 2023-03-10 DIAGNOSIS — Q6211 Congenital occlusion of ureteropelvic junction: Secondary | ICD-10-CM | POA: Diagnosis not present

## 2023-03-16 ENCOUNTER — Other Ambulatory Visit: Payer: Self-pay | Admitting: Internal Medicine

## 2023-04-30 ENCOUNTER — Other Ambulatory Visit: Payer: Self-pay | Admitting: Cardiovascular Disease

## 2023-06-08 DIAGNOSIS — Z85828 Personal history of other malignant neoplasm of skin: Secondary | ICD-10-CM | POA: Diagnosis not present

## 2023-06-08 DIAGNOSIS — L821 Other seborrheic keratosis: Secondary | ICD-10-CM | POA: Diagnosis not present

## 2023-06-08 DIAGNOSIS — D225 Melanocytic nevi of trunk: Secondary | ICD-10-CM | POA: Diagnosis not present

## 2023-06-08 DIAGNOSIS — L578 Other skin changes due to chronic exposure to nonionizing radiation: Secondary | ICD-10-CM | POA: Diagnosis not present

## 2023-06-26 ENCOUNTER — Other Ambulatory Visit: Payer: Self-pay | Admitting: Family Medicine

## 2023-07-06 ENCOUNTER — Other Ambulatory Visit: Payer: Self-pay | Admitting: Family Medicine

## 2023-07-06 ENCOUNTER — Ambulatory Visit (INDEPENDENT_AMBULATORY_CARE_PROVIDER_SITE_OTHER): Payer: BC Managed Care – PPO | Admitting: Family Medicine

## 2023-07-06 ENCOUNTER — Encounter: Payer: Self-pay | Admitting: Family Medicine

## 2023-07-06 VITALS — BP 118/78 | HR 55 | Temp 97.5°F | Ht 70.0 in | Wt 187.2 lb

## 2023-07-06 DIAGNOSIS — I10 Essential (primary) hypertension: Secondary | ICD-10-CM | POA: Diagnosis not present

## 2023-07-06 DIAGNOSIS — E785 Hyperlipidemia, unspecified: Secondary | ICD-10-CM | POA: Diagnosis not present

## 2023-07-06 DIAGNOSIS — Z125 Encounter for screening for malignant neoplasm of prostate: Secondary | ICD-10-CM

## 2023-07-06 DIAGNOSIS — Z0000 Encounter for general adult medical examination without abnormal findings: Secondary | ICD-10-CM

## 2023-07-06 LAB — CBC WITH DIFFERENTIAL/PLATELET
Basophils Absolute: 0 10*3/uL (ref 0.0–0.1)
Basophils Relative: 1.4 % (ref 0.0–3.0)
Eosinophils Absolute: 0.1 10*3/uL (ref 0.0–0.7)
Eosinophils Relative: 4.3 % (ref 0.0–5.0)
HCT: 41.7 % (ref 39.0–52.0)
Hemoglobin: 14.1 g/dL (ref 13.0–17.0)
Lymphocytes Relative: 21.1 % (ref 12.0–46.0)
Lymphs Abs: 0.7 10*3/uL (ref 0.7–4.0)
MCHC: 33.8 g/dL (ref 30.0–36.0)
MCV: 94.8 fL (ref 78.0–100.0)
Monocytes Absolute: 0.4 10*3/uL (ref 0.1–1.0)
Monocytes Relative: 11.2 % (ref 3.0–12.0)
Neutro Abs: 2.1 10*3/uL (ref 1.4–7.7)
Neutrophils Relative %: 62 % (ref 43.0–77.0)
Platelets: 175 10*3/uL (ref 150.0–400.0)
RBC: 4.4 Mil/uL (ref 4.22–5.81)
RDW: 13.1 % (ref 11.5–15.5)
WBC: 3.4 10*3/uL — ABNORMAL LOW (ref 4.0–10.5)

## 2023-07-06 LAB — COMPREHENSIVE METABOLIC PANEL
ALT: 21 U/L (ref 0–53)
AST: 24 U/L (ref 0–37)
Albumin: 4.1 g/dL (ref 3.5–5.2)
Alkaline Phosphatase: 49 U/L (ref 39–117)
BUN: 17 mg/dL (ref 6–23)
CO2: 31 meq/L (ref 19–32)
Calcium: 9.2 mg/dL (ref 8.4–10.5)
Chloride: 103 meq/L (ref 96–112)
Creatinine, Ser: 1.05 mg/dL (ref 0.40–1.50)
GFR: 76.54 mL/min (ref >60.00)
Glucose, Bld: 98 mg/dL (ref 70–99)
Potassium: 4.5 meq/L (ref 3.5–5.1)
Sodium: 139 meq/L (ref 135–145)
Total Bilirubin: 0.6 mg/dL (ref 0.2–1.2)
Total Protein: 6.5 g/dL (ref 6.0–8.3)

## 2023-07-06 LAB — LIPID PANEL
Cholesterol: 156 mg/dL (ref 0–200)
HDL: 64.4 mg/dL (ref >39.00)
LDL Cholesterol: 83 mg/dL (ref 0–99)
NonHDL: 91.63
Total CHOL/HDL Ratio: 2
Triglycerides: 43 mg/dL (ref 0.0–149.0)
VLDL: 8.6 mg/dL (ref 0.0–40.0)

## 2023-07-06 LAB — PSA: PSA: 0.74 ng/mL (ref 0.10–4.00)

## 2023-07-06 NOTE — Patient Instructions (Addendum)
Please stop by lab before you go If you have mychart- we will send your results within 3 business days of [place] receiving them.  If you do not have mychart- we will call you about results within 5 business days of [place] receiving them.  *please also note that you will see labs on mychart as soon as they post. I will later go in and write notes on them- will say "notes from Dr. Durene Cal"   No changes today unless labs lead [place] to make changes  Recommended follow up: Return in about 1 year (around 07/05/2024) for physical or sooner if needed.Schedule b4 you leave. As long as you check blood pressure at least monthly to make sure staying down

## 2023-07-06 NOTE — Progress Notes (Signed)
Phone: [phone]   Subjective:  Patient presents today for their annual physical. Chief complaint-noted.   See problem oriented charting- ROS- full  review of systems was completed and negative  Per full ROS sheet completed by patient- some aches and pains like hip and back at times but not severe enough for workup  The following were reviewed and entered/updated in epic: Past Medical History:  Diagnosis Date   Adenomatous colon polyp    2013, 5 year repeat   Allergy    Coronary artery disease    Diverticulosis    HTN (hypertension)    Hyperlipidemia    no rx. total just above 200, HDL great, LDL in 120s 130s   Internal hemorrhoids    Leukopenia    stable in the 3s   Migraine    relpax about once a month   Patient Active Problem List   Diagnosis Date Noted   Thoracic aortic aneurysm (HCC) 09/04/2020    Priority: High   Aortic atherosclerosis (HCC) 03/29/2021    Priority: Medium    BPPV (benign paroxysmal positional vertigo), left 12/28/2019    Priority: Medium    Essential hypertension 08/28/2019    Priority: Medium    Allergic rhinitis 06/16/2017    Priority: Medium    Migraine     Priority: Medium    Hyperlipidemia     Priority: Medium    Adenomatous colon polyp     Priority: Low   Leukopenia     Priority: Low   Medial epicondylitis of elbow, right 07/16/2019    Priority: 1.   Past Surgical History:  Procedure Laterality Date   ANAL FISSURE REPAIR  2007   COLONOSCOPY W/ POLYPECTOMY     HERNIA REPAIR     as infant; bilateral   right elbow surgery     VASECTOMY      Family History  Problem Relation Age of Onset   Lung cancer Mother        smoker, died 97 age 69   Heart failure Father        rheumatic fever-died 23 months after mother, did not take care of himself   Mitral valve prolapse Father    Colon cancer Maternal Grandfather        early 12s   Stomach cancer Neg Hx     Medications- reviewed and updated Current Outpatient Medications   Medication Sig Dispense Refill   ezetimibe (ZETIA) 10 MG tablet TAKE 1 TABLET BY MOUTH EVERY DAY 90 tablet 3   fexofenadine (ALLEGRA) 180 MG tablet Take 180 mg by mouth daily.     pantoprazole (PROTONIX) 20 MG tablet TAKE 1 TABLET BY MOUTH EVERY DAY 90 tablet 3   rosuvastatin (CRESTOR) 10 MG tablet TAKE 1 TABLET BY MOUTH EVERY DAY 90 tablet 3   valsartan (DIOVAN) 80 MG tablet TAKE 1 TABLET BY MOUTH EVERY DAY 90 tablet 3   No current facility-administered medications for this visit.    Allergies-reviewed and updated No Known Allergies  Social History   Social History Narrative   Married (wife patient of Dr. Durene Cal), 2 daughters Shanda Bumps NP (in [place] with palliative care, had been in [place] with oncology) and Lequita Halt  ([employer] grad going into insurance in [place] 2019) .   - 1 granddaughter 80 months old in 2023.          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    Prior Worked at [employer]: running, biking, golf   -right handed   Objective  Objective:  BP 118/78   Pulse (!) 55   Temp (!) 97.5 F (36.4 C)   Ht 5\' 10"  (1.778 m)   Wt 187 lb 3.2 oz (84.9 kg)   SpO2 97%   BMI 26.86 kg/m  Gen: NAD, resting comfortably HEENT: Mucous membranes are moist. Oropharynx normal Neck: no thyromegaly CV: RRR no murmurs rubs or gallops Lungs: CTAB no crackles, wheeze, rhonchi Abdomen: soft/nontender/nondistended/normal bowel sounds. No rebound or guarding.  Ext: no edema Skin: warm, dry Neuro: grossly normal, moves all extremities, PERRLA   Assessment and Plan  62 y.o. male presenting for annual physical.  Health Maintenance counseling: 1. Anticipatory guidance: Patient counseled regarding regular dental exams -q6 months, eye exams -yearly at least,  avoiding smoking and second hand smoke, limiting alcohol to 2 beverages per  day - 7 per week, no illicit drugs .   2. Risk factor reduction:  Advised patient of need for regular exercise and diet rich and fruits and vegetables to reduce risk of heart attack and stroke.  Exercise- doing low weight higher reps, perhaps less aerobic- less running.  Diet/weight management-weight up 8 lbs in last year - encouraged stability- around 180 at home scales.  Wt Readings from Last 3 Encounters:  07/06/23 187 lb 3.2 oz (84.9 kg)  03/08/23 184 lb 6.4 oz (83.6 kg)  10/01/22 184 lb (83.5 kg)  3. Immunizations/screenings/ancillary studies- opts out COVID, otherwise up to date  Immunization History  Administered Date(s) Administered   Influenza Inj Mdck Quad Pf 04/25/2019, 05/03/2022   Influenza Split 04/10/2012, 03/28/2013   Influenza, Seasonal, Injecte, Preservative Fre 04/22/2023   Influenza,inj,Quad PF,6+ Mos 05/09/2015, 03/18/2016, 05/18/2018, 05/18/2019, 04/21/2021   Influenza-Unspecified 04/18/2013, 05/13/2014, 04/11/2017, 05/18/2018, 04/23/2020   Moderna Covid-19 Fall Seasonal Vaccine 54yrs & older 05/03/2022   PFIZER(Purple Top)SARS-COV-2 Vaccination 10/04/2019, 10/29/2019, 05/22/2020   Pfizer Covid-19 Vaccine Bivalent Booster 9yrs & up 06/21/2021   Td 07/28/2007   Tdap 06/19/2018   Zoster Recombinant(Shingrix) 06/19/2018, 09/28/2018   4. Prostate cancer screening- low risk prior trend- update psa today   Lab Results  Component Value Date   PSA 0.54 07/01/2022   PSA 0.54 06/25/2020   PSA 0.53 06/21/2019  5. Colon cancer screening -  history of adenomatous polyp of the colon but not on last colonoscopy 05/19/2017 - 10-year repeat recommended   6. Skin cancer screening- follows with Dr. Emily Filbert  1-2 times a year depending on findings- mohs in the past. Advised regular sunscreen use. Denies worrisome, changing, or new skin lesions.   7. Smoking associated screening (lung cancer screening, AAA screen 65-75, UA)- Never smoker  8. STD screening - opts out as monogamous with  wife    Status of chronic or acute concerns   # aortic dilation S: Incidental finding on imaging for coronary calcium scoring at 42 mm in January 2022.  Stable at 40 mm September 2022. Repeat with Dr. Laneta Simmers 08/27/22.  -per Dr. Scharlene Gloss "-40 mm on 03/08/23  Stable over 2-year.  We discussed this is likely normal for his age.  I have recommended no further testing.  Suspect this will not be an issue for him in life." -Cardiothoracic surgery has mention possible 1 year follow-up from 08/27/2022  A/P: cardiology thinks this is likely from birth/normal variant for his age Still to be  cautious Systolic blood pressure goal 105-1 20 - annual follow-up? Per cardiothoracic surgery but cardiology has released him -avoid quiniolone antibiotics if possible  #hypertension-more aggressive blood pressure goal due to potential aneurysm S: medication: valsartan 80mg  -prior amlodipine 10 mg but bp lower Home readings #s: no recent checks BP Readings from Last 3 Encounters:  07/06/23 118/78  03/08/23 132/78  10/01/22 118/78  A/P: stable- continue current medicines   #hyperlipidemia with coronary calcium score of 68 but focus of calcium in proximal LAD.  LDL before starting medication 136 S: Medication:Rosuvastatin 10 mg twice weekly, zetia 10 mg- cardiology ok with <100 Lab Results  Component Value Date   CHOL 167 07/01/2022   HDL 69.90 07/01/2022   LDLCALC 88 07/01/2022   LDLDIRECT 132.8 05/23/2013   TRIG 49.0 07/01/2022   CHOLHDL 2 07/01/2022   A/P: cardiology ok with LDL 100 or less- at goal last year- update today- continue current medications for now    #Left hydronephrosis- dilated left renal pelvis- Dr. Annabell Howells [place] renal imaging flow- he has planned follow up but this is likely congenital   #GERD- has worked with Dr. Rhea Belton- and able ot get PPI down to 20 mg in 2023- continue current medications    Recommended follow up: Return in about 1 year (around 07/05/2024) for physical or sooner if  needed.Schedule b4 you leave. As long as you check blood pressure at least monthly to make sure staying down  Lab/Order associations: fasting   ICD-10-CM   1. Preventative health care  Z00.00     2. Essential hypertension  I10 Comprehensive metabolic panel    CBC with Differential/Platelet    Lipid panel    3. Hyperlipidemia, unspecified hyperlipidemia type  E78.5 Comprehensive metabolic panel    CBC with Differential/Platelet    Lipid panel    4. Screening for prostate cancer  Z12.5 PSA      No orders of the defined types were placed in this encounter.   Return precautions advised.  Tana Conch, MD

## 2023-07-27 ENCOUNTER — Other Ambulatory Visit: Payer: Self-pay | Admitting: Surgery

## 2023-07-27 DIAGNOSIS — I7121 Aneurysm of the ascending aorta, without rupture: Secondary | ICD-10-CM

## 2023-08-17 DIAGNOSIS — Q6211 Congenital occlusion of ureteropelvic junction: Secondary | ICD-10-CM | POA: Diagnosis not present

## 2023-08-25 DIAGNOSIS — Q6211 Congenital occlusion of ureteropelvic junction: Secondary | ICD-10-CM | POA: Diagnosis not present

## 2023-08-25 DIAGNOSIS — N1330 Unspecified hydronephrosis: Secondary | ICD-10-CM | POA: Diagnosis not present

## 2023-08-25 DIAGNOSIS — N281 Cyst of kidney, acquired: Secondary | ICD-10-CM | POA: Diagnosis not present

## 2023-08-25 DIAGNOSIS — N2889 Other specified disorders of kidney and ureter: Secondary | ICD-10-CM | POA: Diagnosis not present

## 2023-09-02 NOTE — Progress Notes (Deleted)
  History of Present Illness: Mr. Deidrick Rainey. Requena is a 63 year old gentleman with a past history of hypertension, aortic atherosclerosis, dyslipidemia, coronary artery disease, leukopenia, and vertigo.  He was incidentally noted to have mild dilation of the ascending aorta on a calcium scoring CT scan a few years ago.  He has been followed in our office for annual surveillance.  At last year's visit, the CT scan showed a stable 4.0 cm thoracic aortic aneurysm.  He returns today for scheduled follow-up   Current Outpatient Medications  Medication Sig Dispense Refill   ezetimibe (ZETIA) 10 MG tablet TAKE 1 TABLET BY MOUTH EVERY DAY 90 tablet 3   fexofenadine (ALLEGRA) 180 MG tablet Take 180 mg by mouth daily.     pantoprazole (PROTONIX) 20 MG tablet TAKE 1 TABLET BY MOUTH EVERY DAY 90 tablet 3   rosuvastatin (CRESTOR) 10 MG tablet TAKE 1 TABLET BY MOUTH EVERY DAY 90 tablet 3   valsartan (DIOVAN) 80 MG tablet TAKE 1 TABLET BY MOUTH EVERY DAY 90 tablet 3   No current facility-administered medications for this visit.    Physical Exam: ***  Diagnostic Tests: ***  Impression / Plan: ***   Leary Roca, PA-C [facility] [phone]

## 2023-09-05 ENCOUNTER — Ambulatory Visit: Payer: BC Managed Care – PPO

## 2023-09-05 ENCOUNTER — Other Ambulatory Visit: Payer: BC Managed Care – PPO

## 2023-09-15 DIAGNOSIS — Q6211 Congenital occlusion of ureteropelvic junction: Secondary | ICD-10-CM | POA: Diagnosis not present

## 2023-09-23 ENCOUNTER — Encounter: Payer: Self-pay | Admitting: Family Medicine

## 2023-09-23 ENCOUNTER — Other Ambulatory Visit: Payer: Self-pay

## 2023-09-23 MED ORDER — VALSARTAN 160 MG PO TABS: 80.0000 mg | ORAL_TABLET | Freq: Every day | ORAL | 3 refills | Status: DC

## 2023-09-26 ENCOUNTER — Other Ambulatory Visit: Payer: Self-pay

## 2023-09-26 MED ORDER — VALSARTAN 160 MG PO TABS: 160.0000 mg | ORAL_TABLET | Freq: Every day | ORAL | 3 refills | Status: AC

## 2023-10-07 ENCOUNTER — Encounter: Payer: Self-pay | Admitting: Family Medicine

## 2023-11-07 ENCOUNTER — Encounter: Payer: Self-pay | Admitting: Family Medicine

## 2023-11-09 ENCOUNTER — Other Ambulatory Visit: Payer: Self-pay

## 2023-11-09 DIAGNOSIS — M4316 Spondylolisthesis, lumbar region: Secondary | ICD-10-CM

## 2023-11-14 NOTE — Progress Notes (Unsigned)
    Matthew Fox D.[name] [address] [place] [address] Phone: [phone]   Assessment and Plan:     There are no diagnoses linked to this encounter.  ***   Pertinent previous records reviewed include ***    Follow Up: ***     Subjective:   I, Matthew Fox, am serving as a Neurosurgeon for Doctor Matthew Fox  Chief Complaint: low back pain   HPI:   11/15/2023 Patient is aq 63 year old male with low back pain. Patent states   Relevant Historical Information: ***  Additional pertinent review of systems negative.   Current Outpatient Medications:    ezetimibe  (ZETIA ) 10 MG tablet, TAKE 1 TABLET BY MOUTH EVERY DAY, Disp: 90 tablet, Rfl: 3   fexofenadine (ALLEGRA) 180 MG tablet, Take 180 mg by mouth daily., Disp: , Rfl:    pantoprazole  (PROTONIX ) 20 MG tablet, TAKE 1 TABLET BY MOUTH EVERY DAY, Disp: 90 tablet, Rfl: 3   rosuvastatin  (CRESTOR ) 10 MG tablet, TAKE 1 TABLET BY MOUTH EVERY DAY, Disp: 90 tablet, Rfl: 3   valsartan  (DIOVAN ) 160 MG tablet, Take 1 tablet (160 mg total) by mouth daily., Disp: 90 tablet, Rfl: 3   Objective:     There were no vitals filed for this visit.    There is no height or weight on file to calculate BMI.    Physical Exam:    ***   Electronically signed by:  Marshall Skeeter D.[name] 10:40 AM 11/14/23

## 2023-11-15 ENCOUNTER — Ambulatory Visit (INDEPENDENT_AMBULATORY_CARE_PROVIDER_SITE_OTHER)

## 2023-11-15 ENCOUNTER — Ambulatory Visit (INDEPENDENT_AMBULATORY_CARE_PROVIDER_SITE_OTHER): Admitting: Sports Medicine

## 2023-11-15 VITALS — BP 110/80 | HR 66 | Ht 70.0 in | Wt 186.0 lb

## 2023-11-15 DIAGNOSIS — M9902 Segmental and somatic dysfunction of thoracic region: Secondary | ICD-10-CM

## 2023-11-15 DIAGNOSIS — M4316 Spondylolisthesis, lumbar region: Secondary | ICD-10-CM | POA: Diagnosis not present

## 2023-11-15 DIAGNOSIS — M545 Low back pain, unspecified: Secondary | ICD-10-CM | POA: Diagnosis not present

## 2023-11-15 DIAGNOSIS — G8929 Other chronic pain: Secondary | ICD-10-CM | POA: Diagnosis not present

## 2023-11-15 DIAGNOSIS — M47816 Spondylosis without myelopathy or radiculopathy, lumbar region: Secondary | ICD-10-CM | POA: Diagnosis not present

## 2023-11-15 DIAGNOSIS — M25551 Pain in right hip: Secondary | ICD-10-CM | POA: Diagnosis not present

## 2023-11-15 DIAGNOSIS — M9903 Segmental and somatic dysfunction of lumbar region: Secondary | ICD-10-CM | POA: Diagnosis not present

## 2023-11-15 DIAGNOSIS — M9905 Segmental and somatic dysfunction of pelvic region: Secondary | ICD-10-CM

## 2023-11-15 NOTE — Patient Instructions (Signed)
 Low back HEP  PT referral  Tylenol for day to day pain relief  4 week follow up

## 2023-11-16 ENCOUNTER — Encounter: Payer: Self-pay | Admitting: Sports Medicine

## 2023-11-28 ENCOUNTER — Encounter: Payer: Self-pay | Admitting: Family Medicine

## 2023-11-28 NOTE — Therapy (Signed)
 OUTPATIENT PHYSICAL THERAPY THORACOLUMBAR EVALUATION   Patient Name: Matthew Fox MRN: [id number] DOB:02-Mar-1961, 63 y.o., male Today's Date: 11/30/2023  END OF SESSION:  PT End of Session - 11/29/23 1722     Visit Number 1    Number of Visits 4    Date for PT Re-Evaluation 01/24/24    Authorization Type [employer]    PT Start Time 1625    PT Stop Time 1717    PT Time Calculation (min) 52 min    Activity Tolerance Patient tolerated treatment well    Behavior During Therapy WFL for tasks assessed/performed             Past Medical History:  Diagnosis Date   Adenomatous colon polyp    2013, 5 year repeat   Allergy    Coronary artery disease    Diverticulosis    HTN (hypertension)    Hyperlipidemia    no rx. total just above 200, HDL great, LDL in 120s 130s   Internal hemorrhoids    Leukopenia    stable in the 3s   Migraine    relpax  about once a month   Past Surgical History:  Procedure Laterality Date   ANAL FISSURE REPAIR  2007   COLONOSCOPY W/ POLYPECTOMY     HERNIA REPAIR     as infant; bilateral   right elbow surgery     VASECTOMY     Patient Active Problem List   Diagnosis Date Noted   Aortic atherosclerosis (HCC) 03/29/2021   Thoracic aortic aneurysm (HCC) 09/04/2020   BPPV (benign paroxysmal positional vertigo), left 12/28/2019   Essential hypertension 08/28/2019   Medial epicondylitis of elbow, right 07/16/2019   Allergic rhinitis 06/16/2017   Adenomatous colon polyp    Migraine    Hyperlipidemia    Leukopenia      REFERRING PROVIDER: Ulysees Gander, DO  REFERRING DIAG:  M54.50,G89.29 (ICD-10-CM) - Chronic bilateral low back pain without sciatica  M43.16 (ICD-10-CM) - Anterolisthesis of lumbar spine    Rationale for Evaluation and Treatment: Rehabilitation  THERAPY DIAG:  Other low back pain  ONSET DATE: Pain for approx 1 year  SUBJECTIVE:                                                                                                                                                                                            SUBJECTIVE STATEMENT: Pt has been having R hip and lumbar pain for approx 1 year.  Pt denies any specific injury.  Pt saw MD on 11/15/23.  MD note indicated consistent with musculoskeletal pain likely resulting from grade 1 anterolisthesis L4 and L5  and L4 pars defects chronic in nature.  MD ordered  PT for low back and core strengthening.   Pt had an x ray.  Pt received OMT.  He had some soreness and felt ok after OMT.  He states it's too early to tell if it helped.  He received a HEP from MD.  Pt has been performing them approx 2x/wk. Pt states the HEP has helped.     Pt states "I know it's there all the time."  Pain is worse in the morning.  He has stiffness > pain 1st thing in AM.  Pain does ease off after he gets moving.  Pt's stiffness and pain improve after 30 mins.   Pt has pain with bending including bending over to pick up the paper.  He states he is slower with bending activities.  He has pain with coughing.  Pt has a hx of running though has reduced amount of running due to his R hip pain.  He runs 3 to 9 miles per week.      Pt is not limited with activities and mobility.  Pt plays golf and pickleball without pain.  Pt is regularly active.  He runs, does sit ups and squats without weight, and uses bowflex.  Pt also uses the elliptical.  He reports no increased pain with workouts.   Pt saw cardiology for thoracic aortic aneurysm and was cleared.  He states he was released from cardiology and has no restrictions.  Pt states cardiologist informed him to try not to lift > 50 #'s.  PERTINENT HISTORY:  Dx of lumbar (L4-5) anterolisthesis.  MD note indicated L4 pars defect chronic in nature Ascending thoracic aortic aneurysm  HTN, CAD, R elbow surgery for medial epicondylitis    PAIN:  NPRS:  2-3/10 current, 6/10 worst pain (in the AM), 1/10 best Location: central lower lumbar   PRECAUTIONS:  Other: per dx, Ascending thoracic aortic aneurysm    WEIGHT BEARING RESTRICTIONS: Pt states cardiologist informed him to try not to lift > 50 #'s due to thoracic aortic aneursym  FALLS:  Has patient fallen in last 6 months? No  LIVING ENVIRONMENT: Lives with: lives with their spouse Lives in: 2 story home Stairs: yes   PLOF: Independent  PATIENT GOALS: to learn exercises that will help, decreasing stiffness and pain, to avoid surgery   OBJECTIVE:  Note: Objective measures were completed at Evaluation unless otherwise noted.  DIAGNOSTIC FINDINGS:  Lumbar X rays: FINDINGS: There is no evidence of lumbar spine fracture. Mild levocurvature of spine. Grade 2 anterolisthesis with probable bilateral pars defect at L4-5. Marked narrow intervertebral space at L4-5, L5-S1. Mild anterior spurring there is throughout the thoracic and lumbar spine. Question small bowel wall thickening of the mid abdomen.   IMPRESSION: 1. Grade 2 anterolisthesis with probable bilateral pars defect at L4-5. 2. Marked degenerative joint changes of lower lumbar spine. 3. Question small bowel wall thickening of the mid abdomen. Recommend further evaluation with an abdominal series.  PATIENT SURVEYS:  Give Modified Oswestry next visit.  COGNITION: Overall cognitive status: Within functional limits for tasks assessed      PALPATION: Pt had no tenderness in Lumbar SP's or lumbar paraspinals.  Pt had    LUMBAR ROM:   AROM eval  Flexion WNL  Extension   Right lateral flexion WFL  Left lateral flexion WFL  Right rotation WFL  Left rotation WFL   (Blank rows = not tested)  LOWER EXTREMITY ROM:  Active  Right eval Left eval  Hip flexion    Hip extension    Hip abduction [facility]  Hip adduction    Hip internal rotation    Hip external rotation    Knee flexion    Knee extension [facility]  Ankle dorsiflexion    Ankle plantarflexion    Ankle inversion    Ankle eversion     (Blank rows =  not tested)  LOWER EXTREMITY MMT:    MMT Right eval Left eval  Hip flexion 4+/5 4+/5  Hip extension    Hip abduction 5/5 5/5  Hip adduction    Hip internal rotation    Hip external rotation 5/5 5/5  Knee flexion 5/5 5/5  Knee extension 5/5 5/5  Ankle dorsiflexion    Ankle plantarflexion    Ankle inversion    Ankle eversion     (Blank rows = not tested)    GAIT: Assistive device utilized: None Level of assistance: Complete Independence Comments: stiff lumbar, decreased pelvic rotation, no pain  TREATMENT:                                                                                                                                Reviewed HEP from MD which includes prone quad stretch with strap, supine HS stretch with strap, supine piriformis stretch, LTR's, pelvic tilt, child pose prayer stretch.  PT answered pt's questions concerning HEP.  PT instructed pt to not perform anterior pelvic tilt.   PT used a spinal model and educated pt in relevant anatomy and biomechanics.  PT educated pt in dx and what movements/positions to avoid.  PT educated pt in the importance of neutral spine and not going into lumbar extension due to dx/diagnostic findings.  PT instructed pt in not performing the lumbar extension machine in the gym.      PATIENT EDUCATION:  Education details: see above.  HEP, POC, rationale of interventions, and what to expect next treatment.  PT answered pt's questions. Person educated: Patient Education method: Explanation, Demonstration, Tactile cues, Verbal cues, and Handouts Education comprehension: verbalized understanding, returned demonstration, verbal cues required, tactile cues required, and needs further education  HOME EXERCISE PROGRAM: Pt has a HEP from MD.  PT will plan to update HEP at a later date.  ASSESSMENT:  CLINICAL IMPRESSION: Patient is a 63 y.o. male with dx's of chronic bilateral low back pain without sciatica and lumbar anterolisthesis.  Pt  has been having R hip and lumbar pain for approx 1 year.  MD note indicated L4 pars defects chronic in nature.  Pt states he is not limited with activities and mobility and plays golf and pickleball without adverse effects.  Pt states he can feel it all the time though stiffness and pain are worst 1st thing in AM.  His pain and stiffness does ease off as he moves more, typically after 30 mins.  Pt has pain with bending and  is slower with bending activities.  Pt is regularly active including working out and running.  He received a HEP from MD and has been performing them approx 2x/wk.  Pt states the HEP has helped.  Pt should benefit from skilled PT to improve core strength, to learn appropriate exercises and contraindicated positions/movements, and to improve function.      OBJECTIVE IMPAIRMENTS: Abnormal gait, decreased knowledge of condition, decreased strength, increased fascial restrictions, and pain.   ACTIVITY LIMITATIONS: bending  PARTICIPATION LIMITATIONS:   PERSONAL FACTORS: 1 comorbidity: Ascending aortic thoracic aneurysm are also affecting patient's functional outcome.   REHAB POTENTIAL: Good  CLINICAL DECISION MAKING: Stable/uncomplicated  EVALUATION COMPLEXITY: Low   GOALS:   SHORT TERM GOALS: Target date: 12/20/2023   Pt will tolerate exercises without adverse effects for improved core strength and reduced stress to lumbar. Baseline: Goal status: INITIAL    LONG TERM GOALS: Target date: 01/24/2024  PT will answer all questions concerning dx and appropriate exercises/activities, and pt will demo good understanding in order to safely perform daily and workout activities with decreased risk of injury and reduced strain on lumbar.   Baseline:  Goal status: INITIAL  2.  Pt will be independent and compliant with HEP in order to establish a consistent exercise routine to improve core strength for improved spinal stabilization and reduced strain/stress on lumbar.   Baseline:   Goal status: INITIAL  3.  Pt will report at least a 50% improvement in pain and stiffness overall.  Baseline:  Goal status: INITIAL    PLAN:  PT FREQUENCY: once every other week  PT DURATION: other: 6-8 weeks  PLANNED INTERVENTIONS: 97164- PT Re-evaluation, 97750- Physical Performance Testing, 97110-Therapeutic exercises, 97530- Therapeutic activity, [id number]- Neuromuscular re-education, 97535- Self Care, 29562- Manual therapy, [phone]- Gait training, [phone]- Aquatic Therapy, [phone]- Electrical stimulation (unattended), [phone]- Ultrasound, Patient/Family education, Balance training, Stair training, Taping, Dry Needling, Cryotherapy, and Moist heat.  PLAN FOR NEXT SESSION: Modified Oswestry next visit.  Assess HS flexibility.  Perform TrA contractions.  Ensure pt doesn't hold his breath with TrA contractions or other exercises.  Consider bridging, S/L clams with TrA, Q-ped alt UE raises or LE raises with TrA in neutral spine. Instruct in HEP and update HEP.   Trina Fujita III PT, DPT 11/30/23 11:38 PM

## 2023-11-29 ENCOUNTER — Ambulatory Visit (HOSPITAL_BASED_OUTPATIENT_CLINIC_OR_DEPARTMENT_OTHER): Attending: Sports Medicine | Admitting: Physical Therapy

## 2023-11-29 ENCOUNTER — Other Ambulatory Visit: Payer: Self-pay

## 2023-11-29 DIAGNOSIS — M5459 Other low back pain: Secondary | ICD-10-CM | POA: Insufficient documentation

## 2023-11-29 DIAGNOSIS — G8929 Other chronic pain: Secondary | ICD-10-CM | POA: Insufficient documentation

## 2023-11-29 DIAGNOSIS — M4316 Spondylolisthesis, lumbar region: Secondary | ICD-10-CM | POA: Diagnosis not present

## 2023-11-29 DIAGNOSIS — M545 Low back pain, unspecified: Secondary | ICD-10-CM | POA: Insufficient documentation

## 2023-11-30 ENCOUNTER — Encounter (HOSPITAL_BASED_OUTPATIENT_CLINIC_OR_DEPARTMENT_OTHER): Payer: Self-pay | Admitting: Physical Therapy

## 2023-12-06 ENCOUNTER — Telehealth: Admitting: Physician Assistant

## 2023-12-06 DIAGNOSIS — J069 Acute upper respiratory infection, unspecified: Secondary | ICD-10-CM | POA: Diagnosis not present

## 2023-12-06 MED ORDER — FLUTICASONE PROPIONATE 50 MCG/ACT NA SUSP: 2.0000 | Freq: Every day | NASAL | 0 refills | Status: DC

## 2023-12-06 NOTE — Progress Notes (Signed)
 I have spent 5 minutes in review of e-visit questionnaire, review and updating patient chart, medical decision making and response to patient.   Piedad Climes, PA-C

## 2023-12-06 NOTE — Progress Notes (Signed)

## 2023-12-16 NOTE — Progress Notes (Signed)
 [facility] [address] [place] [address] Phone: [phone]   Assessment and Plan:     1. Chronic bilateral low back pain without sciatica 2. Anterolisthesis of lumbar spine 3. Somatic dysfunction of thoracic region 4. Somatic dysfunction of lumbar region 5. Somatic dysfunction of pelvic region 6. Somatic dysfunction of sacral region 7. Somatic dysfunction of rib region -Chronic with exacerbation, subsequent visit - Overall improvement with OMT, first physical therapy visit, HEP, however patient continues to experience flares of low back pain.  Consistent with low back pain from degenerative changes in lower lumbar spine including grade 2 anterolisthesis L4 and L5 and L4 pars defects - We will continue conservative treatment plan with HEP, physical therapy - Start meloxicam 15 mg daily x2 weeks.  If still having pain after 2 weeks, complete 3rd-week of NSAID. May use remaining NSAID as needed once daily for pain control.  Do not to use additional over-the-counter NSAIDs (ibuprofen, naproxen, Advil, Aleve, etc.) while taking prescription NSAIDs.  May use Tylenol [phone] mg 2 to 3 times a day for breakthrough pain. - Patient has received relief with OMT in the past.  Elects for repeat OMT today.  Tolerated well per note below. - Decision today to treat with OMT was based on Physical Exam  After verbal consent patient was treated with HVLA (high velocity low amplitude), ME (muscle energy), FPR (flex positional release), ST (soft tissue), PC/PD (Pelvic Compression/ Pelvic Decompression) techniques in sacrum, rib, thoracic, lumbar, and pelvic areas. Patient tolerated the procedure well with improvement in symptoms.  Patient educated on potential side effects of soreness and recommended to rest, hydrate, and use Tylenol as needed for pain control.   8. Abnormal x-ray  -Incidental finding, initial visit - Prior lumbar x-ray showed  question of small bowel wall thickening of mid abdomen with recommendation of further abdominal series - Will obtain abdominal x-ray at today's visit  Pertinent previous records reviewed include none  Follow Up: 4 weeks for reevaluation.  Could consider repeat OMT   Subjective:   I, Matthew Fox, am serving as a Neurosurgeon for Doctor Matthew Fox   Chief Complaint: low back pain    HPI:    11/15/2023 Patient is a 63 year old male with low back pain. Patent states back pain for a year. No MOI. Pain is worse in the morning. Pain does ease off after he gets moving . Hx of running. Pain radiates to the right hip. No meds for the pain. No numbness or tingling.    12/19/2023 Patient states he is good. Doesn't think he is any worse    Relevant Historical Information: Hypertension, Additional pertinent review of systems negative.   Current Outpatient Medications:    ezetimibe  (ZETIA ) 10 MG tablet, TAKE 1 TABLET BY MOUTH EVERY DAY, Disp: 90 tablet, Rfl: 3   fexofenadine (ALLEGRA) 180 MG tablet, Take 180 mg by mouth daily., Disp: , Rfl:    fluticasone  (FLONASE ) 50 MCG/ACT nasal spray, Place 2 sprays into both nostrils daily., Disp: 16 g, Rfl: 0   meloxicam (MOBIC) 15 MG tablet, Take 1 tablet (15 mg total) by mouth daily., Disp: 30 tablet, Rfl: 0   pantoprazole  (PROTONIX ) 20 MG tablet, TAKE 1 TABLET BY MOUTH EVERY DAY, Disp: 90 tablet, Rfl: 3   rosuvastatin  (CRESTOR ) 10 MG tablet, TAKE 1 TABLET BY MOUTH EVERY DAY, Disp: 90 tablet, Rfl: 3   valsartan  (DIOVAN ) 160 MG tablet, Take 1 tablet (160 mg total)  by mouth daily., Disp: 90 tablet, Rfl: 3   Objective:     Vitals:   12/19/23 1523  BP: 110/80  Pulse: 75  SpO2: 97%  Weight: 178 lb (80.7 kg)  Height: 5\' 10"  (1.778 m)      Body mass index is 25.54 kg/m.    Physical Exam:    General: Well-appearing, cooperative, sitting comfortably in no acute distress.   OMT Physical Exam:  ASIS Compression Test: Positive Right Sacrum: NTTP  bilateral sacral base.  Positive sphinx Rib: Ribs 6-8 stuck in inhalation on right Thoracic: TTP paraspinal, T6-8 RR SL Lumbar: TTP paraspinal, L1-3 RRSL Pelvis: Right anterior innominate    Electronically signed by:  Marshall Skeeter [facility] 4:42 PM 12/19/23

## 2023-12-17 ENCOUNTER — Encounter: Payer: Self-pay | Admitting: Family Medicine

## 2023-12-19 ENCOUNTER — Ambulatory Visit (INDEPENDENT_AMBULATORY_CARE_PROVIDER_SITE_OTHER)

## 2023-12-19 ENCOUNTER — Ambulatory Visit (INDEPENDENT_AMBULATORY_CARE_PROVIDER_SITE_OTHER): Admitting: Sports Medicine

## 2023-12-19 VITALS — BP 110/80 | HR 75 | Ht 70.0 in | Wt 178.0 lb

## 2023-12-19 DIAGNOSIS — M9908 Segmental and somatic dysfunction of rib cage: Secondary | ICD-10-CM

## 2023-12-19 DIAGNOSIS — R9389 Abnormal findings on diagnostic imaging of other specified body structures: Secondary | ICD-10-CM

## 2023-12-19 DIAGNOSIS — M9903 Segmental and somatic dysfunction of lumbar region: Secondary | ICD-10-CM | POA: Diagnosis not present

## 2023-12-19 DIAGNOSIS — G8929 Other chronic pain: Secondary | ICD-10-CM

## 2023-12-19 DIAGNOSIS — M9902 Segmental and somatic dysfunction of thoracic region: Secondary | ICD-10-CM

## 2023-12-19 DIAGNOSIS — M9905 Segmental and somatic dysfunction of pelvic region: Secondary | ICD-10-CM

## 2023-12-19 DIAGNOSIS — M4316 Spondylolisthesis, lumbar region: Secondary | ICD-10-CM | POA: Diagnosis not present

## 2023-12-19 DIAGNOSIS — M545 Low back pain, unspecified: Secondary | ICD-10-CM | POA: Diagnosis not present

## 2023-12-19 DIAGNOSIS — M9904 Segmental and somatic dysfunction of sacral region: Secondary | ICD-10-CM

## 2023-12-19 DIAGNOSIS — I878 Other specified disorders of veins: Secondary | ICD-10-CM | POA: Diagnosis not present

## 2023-12-19 MED ORDER — MELOXICAM 15 MG PO TABS
15.0000 mg | ORAL_TABLET | Freq: Every day | ORAL | 0 refills | Status: AC
Start: 2023-12-19 — End: ?

## 2023-12-19 NOTE — Patient Instructions (Signed)
-   Start meloxicam 15 mg daily x2 weeks.  If still having pain after 2 weeks, complete 3rd-week of NSAID. May use remaining NSAID as needed once daily for pain control.  Do not to use additional over-the-counter NSAIDs (ibuprofen, naproxen, Advil, Aleve, etc.) while taking prescription NSAIDs.  May use Tylenol [phone] mg 2 to 3 times a day for breakthrough pain. Xrays on the way out  4 week follow up

## 2023-12-21 ENCOUNTER — Ambulatory Visit (HOSPITAL_BASED_OUTPATIENT_CLINIC_OR_DEPARTMENT_OTHER): Admitting: Physical Therapy

## 2023-12-22 ENCOUNTER — Ambulatory Visit (HOSPITAL_BASED_OUTPATIENT_CLINIC_OR_DEPARTMENT_OTHER): Attending: Sports Medicine

## 2023-12-22 ENCOUNTER — Encounter (HOSPITAL_BASED_OUTPATIENT_CLINIC_OR_DEPARTMENT_OTHER): Payer: Self-pay

## 2023-12-22 DIAGNOSIS — M5459 Other low back pain: Secondary | ICD-10-CM | POA: Insufficient documentation

## 2023-12-22 NOTE — Therapy (Signed)
 OUTPATIENT PHYSICAL THERAPY TREATMENT   Patient Name: Matthew Fox MRN: [id number] DOB:Jan 22, 1961, 63 y.o., male Today's Date: 12/23/2023  END OF SESSION:  PT End of Session - 12/22/23 1436     Visit Number 2    Number of Visits 4    Date for PT Re-Evaluation 01/24/24    Authorization Type BCBS    PT Start Time 1435    PT Stop Time 1515    PT Time Calculation (min) 40 min    Activity Tolerance Patient tolerated treatment well    Behavior During Therapy WFL for tasks assessed/performed              Past Medical History:  Diagnosis Date   Adenomatous colon polyp    2013, 5 year repeat   Allergy    Coronary artery disease    Diverticulosis    HTN (hypertension)    Hyperlipidemia    no rx. total just above 200, HDL great, LDL in 120s 130s   Internal hemorrhoids    Leukopenia    stable in the 3s   Migraine    relpax  about once a month   Past Surgical History:  Procedure Laterality Date   ANAL FISSURE REPAIR  2007   COLONOSCOPY W/ POLYPECTOMY     HERNIA REPAIR     as infant; bilateral   right elbow surgery     VASECTOMY     Patient Active Problem List   Diagnosis Date Noted   Aortic atherosclerosis (HCC) 03/29/2021   Thoracic aortic aneurysm (HCC) 09/04/2020   BPPV (benign paroxysmal positional vertigo), left 12/28/2019   Essential hypertension 08/28/2019   Medial epicondylitis of elbow, right 07/16/2019   Allergic rhinitis 06/16/2017   Adenomatous colon polyp    Migraine    Hyperlipidemia    Leukopenia      REFERRING PROVIDER: Ulysees Gander, DO  REFERRING DIAG:  M54.50,G89.29 (ICD-10-CM) - Chronic bilateral low back pain without sciatica  M43.16 (ICD-10-CM) - Anterolisthesis of lumbar spine    Rationale for Evaluation and Treatment: Rehabilitation  THERAPY DIAG:  Other low back pain  ONSET DATE: Pain for approx 1 year  SUBJECTIVE:                                                                                                                                                                                            SUBJECTIVE STATEMENT: Pt reports he was prescribed meloxicam recently and took it last night. Pt reports having significantly decreased pain this morning.   Eval: Pt has been having R hip and lumbar pain for approx 1 year.  Pt denies any specific injury.  Pt saw MD on 11/15/23.  MD note indicated consistent with musculoskeletal pain likely resulting from grade 1 anterolisthesis L4 and L5 and L4 pars defects chronic in nature.  MD ordered  PT for low back and core strengthening.   Pt had an x ray.  Pt received OMT.  He had some soreness and felt ok after OMT.  He states it's too early to tell if it helped.  He received a HEP from MD.  Pt has been performing them approx 2x/wk. Pt states the HEP has helped.     Pt states "I know it's there all the time."  Pain is worse in the morning.  He has stiffness > pain 1st thing in AM.  Pain does ease off after he gets moving.  Pt's stiffness and pain improve after 30 mins.   Pt has pain with bending including bending over to pick up the paper.  He states he is slower with bending activities.  He has pain with coughing.  Pt has a hx of running though has reduced amount of running due to his R hip pain.  He runs 3 to 9 miles per week.      Pt is not limited with activities and mobility.  Pt plays golf and pickleball without pain.  Pt is regularly active.  He runs, does sit ups and squats without weight, and uses bowflex.  Pt also uses the elliptical.  He reports no increased pain with workouts.   Pt saw cardiology for thoracic aortic aneurysm and was cleared.  He states he was released from cardiology and has no restrictions.  Pt states cardiologist informed him to try not to lift > 50 #'s.  PERTINENT HISTORY:  Dx of lumbar (L4-5) anterolisthesis.  MD note indicated L4 pars defect chronic in nature Ascending thoracic aortic aneurysm  HTN, CAD, R elbow surgery for medial  epicondylitis    PAIN:  NPRS:  2-3/10 current, 6/10 worst pain (in the AM), 1/10 best Location: central lower lumbar   PRECAUTIONS: Other: per dx, Ascending thoracic aortic aneurysm    WEIGHT BEARING RESTRICTIONS: Pt states cardiologist informed him to try not to lift > 50 #'s due to thoracic aortic aneursym  FALLS:  Has patient fallen in last 6 months? No  LIVING ENVIRONMENT: Lives with: lives with their spouse Lives in: 2 story home Stairs: yes   PLOF: Independent  PATIENT GOALS: to learn exercises that will help, decreasing stiffness and pain, to avoid surgery   OBJECTIVE:  Note: Objective measures were completed at Evaluation unless otherwise noted.  DIAGNOSTIC FINDINGS:  Lumbar X rays: FINDINGS: There is no evidence of lumbar spine fracture. Mild levocurvature of spine. Grade 2 anterolisthesis with probable bilateral pars defect at L4-5. Marked narrow intervertebral space at L4-5, L5-S1. Mild anterior spurring there is throughout the thoracic and lumbar spine. Question small bowel wall thickening of the mid abdomen.   IMPRESSION: 1. Grade 2 anterolisthesis with probable bilateral pars defect at L4-5. 2. Marked degenerative joint changes of lower lumbar spine. 3. Question small bowel wall thickening of the mid abdomen. Recommend further evaluation with an abdominal series.  PATIENT SURVEYS:  6/5: Modified Oswestry:20%  COGNITION: Overall cognitive status: Within functional limits for tasks assessed      PALPATION: Pt had no tenderness in Lumbar SP's or lumbar paraspinals.  Pt had    LUMBAR ROM:   AROM eval  Flexion WNL  Extension   Right lateral flexion WFL  Left lateral flexion WFL  Right  rotation WFL  Left rotation WFL   (Blank rows = not tested)  LOWER EXTREMITY ROM:     Active  Right eval Left eval  Hip flexion    Hip extension    Hip abduction [facility]  Hip adduction    Hip internal rotation    Hip external rotation    Knee  flexion    Knee extension [facility]  Ankle dorsiflexion    Ankle plantarflexion    Ankle inversion    Ankle eversion     (Blank rows = not tested)  LOWER EXTREMITY MMT:    MMT Right eval Left eval  Hip flexion 4+/5 4+/5  Hip extension    Hip abduction 5/5 5/5  Hip adduction    Hip internal rotation    Hip external rotation 5/5 5/5  Knee flexion 5/5 5/5  Knee extension 5/5 5/5  Ankle dorsiflexion    Ankle plantarflexion    Ankle inversion    Ankle eversion     (Blank rows = not tested)    GAIT: Assistive device utilized: None Level of assistance: Complete Independence Comments: stiff lumbar, decreased pelvic rotation, no pain  TREATMENT:                                                                                                                                6/5: -Modified Oswestry -Supine HSS with strap 30sec ea -PPT 5" x10 - cook bridge x10ea 5" hold -90/90 LE extensions x10ea -sidelying hip abduction 2x10ea Supine SLR with TrA 2x10ea  Standing quad stretch (cues for upright posture) 1/2 kneel hip flexor stretch bil HEP   Eval: Reviewed HEP from MD which includes prone quad stretch with strap, supine HS stretch with strap, supine piriformis stretch, LTR's, pelvic tilt, child pose prayer stretch.  PT answered pt's questions concerning HEP.  PT instructed pt to not perform anterior pelvic tilt.   PT used a spinal model and educated pt in relevant anatomy and biomechanics.  PT educated pt in dx and what movements/positions to avoid.  PT educated pt in the importance of neutral spine and not going into lumbar extension due to dx/diagnostic findings.  PT instructed pt in not performing the lumbar extension machine in the gym.      PATIENT EDUCATION:  Education details: see above.  HEP, POC, rationale of interventions, and what to expect next treatment.  PT answered pt's questions. Person educated: Patient Education method: Explanation, Demonstration, Tactile  cues, Verbal cues, and Handouts Education comprehension: verbalized understanding, returned demonstration, verbal cues required, tactile cues required, and needs further education  HOME EXERCISE PROGRAM: Pt has a HEP from MD.  PT will plan to update HEP at a later date.  Access Code: [email or web address] URL: [email or web address] Date: 12/22/2023 Prepared by: Herb Loges   ASSESSMENT:  CLINICAL IMPRESSION:  Good tolerance for first PT session following IE. Focused on core stability and control with strengthening tasks. Emphasized importance of maintaining  PPT and TrA with exercises. Instructed pt to not push past pain limitations. He felt muscular fatigue in bilateral hips with s/l hip abduction. Reviewed and provided HEP. Will continue to progress as tolerated.   Eval: Patient is a 63 y.o. male with dx's of chronic bilateral low back pain without sciatica and lumbar anterolisthesis.  Pt has been having R hip and lumbar pain for approx 1 year.  MD note indicated L4 pars defects chronic in nature.  Pt states he is not limited with activities and mobility and plays golf and pickleball without adverse effects.  Pt states he can feel it all the time though stiffness and pain are worst 1st thing in AM.  His pain and stiffness does ease off as he moves more, typically after 30 mins.  Pt has pain with bending and is slower with bending activities.  Pt is regularly active including working out and running.  He received a HEP from MD and has been performing them approx 2x/wk.  Pt states the HEP has helped.  Pt should benefit from skilled PT to improve core strength, to learn appropriate exercises and contraindicated positions/movements, and to improve function.      OBJECTIVE IMPAIRMENTS: Abnormal gait, decreased knowledge of condition, decreased strength, increased fascial restrictions, and pain.   ACTIVITY LIMITATIONS: bending  PARTICIPATION LIMITATIONS:   PERSONAL FACTORS: 1 comorbidity:  Ascending aortic thoracic aneurysm are also affecting patient's functional outcome.   REHAB POTENTIAL: Good  CLINICAL DECISION MAKING: Stable/uncomplicated  EVALUATION COMPLEXITY: Low   GOALS:   SHORT TERM GOALS: Target date: 12/20/2023   Pt will tolerate exercises without adverse effects for improved core strength and reduced stress to lumbar. Baseline: Goal status: INITIAL    LONG TERM GOALS: Target date: 01/24/2024  PT will answer all questions concerning dx and appropriate exercises/activities, and pt will demo good understanding in order to safely perform daily and workout activities with decreased risk of injury and reduced strain on lumbar.   Baseline:  Goal status: INITIAL  2.  Pt will be independent and compliant with HEP in order to establish a consistent exercise routine to improve core strength for improved spinal stabilization and reduced strain/stress on lumbar.   Baseline:  Goal status: INITIAL  3.  Pt will report at least a 50% improvement in pain and stiffness overall.  Baseline:  Goal status: INITIAL    PLAN:  PT FREQUENCY: once every other week  PT DURATION: other: 6-8 weeks  PLANNED INTERVENTIONS: 97164- PT Re-evaluation, 97750- Physical Performance Testing, 97110-Therapeutic exercises, 97530- Therapeutic activity, [id number]- Neuromuscular re-education, 97535- Self Care, 29562- Manual therapy, [phone]- Gait training, [phone]- Aquatic Therapy, [phone]- Electrical stimulation (unattended), [phone]- Ultrasound, Patient/Family education, Balance training, Stair training, Taping, Dry Needling, Cryotherapy, and Moist heat.  PLAN FOR NEXT SESSION: Modified Oswestry next visit.  Assess HS flexibility.  Perform TrA contractions.  Ensure pt doesn't hold his breath with TrA contractions or other exercises.  Consider bridging, S/L clams with TrA, Q-ped alt UE raises or LE raises with TrA in neutral spine. Instruct in HEP and update HEP.   Herb Loges, PTA  12/23/23 10:46  AM

## 2023-12-26 ENCOUNTER — Ambulatory Visit: Payer: Self-pay | Admitting: Sports Medicine

## 2023-12-31 ENCOUNTER — Other Ambulatory Visit: Payer: Self-pay | Admitting: Internal Medicine

## 2024-01-02 ENCOUNTER — Telehealth: Payer: Self-pay | Admitting: Internal Medicine

## 2024-01-02 MED ORDER — PANTOPRAZOLE SODIUM 20 MG PO TBEC: 20.0000 mg | DELAYED_RELEASE_TABLET | Freq: Every day | ORAL | 0 refills | Status: DC

## 2024-01-02 NOTE — Telephone Encounter (Signed)
 PT called to get a FU to have continued refills for pantoprazole . He is scheduled for 7/31 but he will run out of the medication in about two weeks. He is asking for a refill to be sent to [facility] on [address]. That will last until his visit. Please advise.

## 2024-01-02 NOTE — Telephone Encounter (Signed)
 Refills sent top [facility] on [address] (Pharmacy in patient's chart - can't find a [facility] on [place]).  Called and LM for pt

## 2024-01-10 NOTE — Progress Notes (Signed)
 Matthew Fox [address] [place] [address] Phone: [phone]   Assessment and Plan:     1. Chronic bilateral low back pain without sciatica 2. Anterolisthesis of lumbar spine -Chronic with exacerbation, subsequent visit - Overall improvement in low back pain with meloxicam  course, HEP, OMT.  Patient felt significant improvement while taking meloxicam  course, however pain has returned since stopping meloxicam .  Still most consistent with flare of low back pain with grade 2 anterolisthesis at L4 on L5 - Use Tylenol 500 to 1000 mg tablets 2-3 times a day for day-to-day pain relief - Use meloxicam  15 mg daily as needed for pain.  Recommend limiting chronic NSAIDs to 1-2 doses per week to prevent long-term side effects. - Continue HEP as tolerated.  Goal of core and low back strengthening and stabilization - If pain returns, recommend patient contact our clinic and we could order lumbar MRI  3. Abnormal x-ray  -Incidental finding, subsequent visit - Prior lumbar x-ray showed question of small bowel wall thickening of mid abdomen with recommendation of further abdominal series.  Abdominal x-ray obtained and unremarkable at previous office visit.  No further workup needed  Pertinent previous records reviewed include none  Follow Up: As needed if no improvement or worsening of symptoms.  If back pain returns or worsens, would order lumbar MRI and follow-up 1 week after imaging   Subjective:   I, Matthew Fox, am serving as a Neurosurgeon for Doctor Morene Mace   Chief Complaint: low back pain    HPI:    11/15/2023 Patient is a 63 year old male with low back pain. Patent states back pain for a year. No MOI. Pain is worse in the morning. Pain does ease off after he gets moving . Hx of running. Pain radiates to the right hip. No meds for the pain. No numbness or tingling.    12/19/2023 Patient states he is good. Doesn't think he is  any worse   01/16/2024 Patient states he is good. Meloxicam  helped a lot    Relevant Historical Information: Hypertension,  Additional pertinent review of systems negative.   Current Outpatient Medications:    ezetimibe  (ZETIA ) 10 MG tablet, TAKE 1 TABLET BY MOUTH EVERY DAY, Disp: 90 tablet, Rfl: 3   fexofenadine (ALLEGRA) 180 MG tablet, Take 180 mg by mouth daily., Disp: , Rfl:    fluticasone  (FLONASE ) 50 MCG/ACT nasal spray, Place 2 sprays into both nostrils daily., Disp: 16 g, Rfl: 0   meloxicam  (MOBIC ) 15 MG tablet, Take 1 tablet (15 mg total) by mouth daily., Disp: 30 tablet, Rfl: 0   pantoprazole  (PROTONIX ) 20 MG tablet, Take 1 tablet (20 mg total) by mouth daily. Please keep your July appointment for further refills.  Thanks., Disp: 30 tablet, Rfl: 0   rosuvastatin  (CRESTOR ) 10 MG tablet, TAKE 1 TABLET BY MOUTH EVERY DAY, Disp: 90 tablet, Rfl: 3   valsartan  (DIOVAN ) 160 MG tablet, Take 1 tablet (160 mg total) by mouth daily., Disp: 90 tablet, Rfl: 3   Objective:     Vitals:   01/16/24 1505  Pulse: 66  SpO2: 96%  Weight: 189 lb (85.7 kg)  Height: 5' 10 (1.778 m)      Body mass index is 27.12 kg/m.    Physical Exam:    Gen: Appears well, nad, nontoxic and pleasant Psych: Alert and oriented, appropriate mood and affect Neuro: sensation intact, strength is 5/5 in upper and lower extremities, muscle tone  wnl Skin: no susupicious lesions or rashes  Back - Normal skin, Spine with normal alignment and no deformity.   No tenderness to vertebral process palpation.   Paraspinous muscles are not tender and without spasm NTTP gluteal musculature Straight leg raise negative Trendelenberg negative Piriformis Test negative Gait normal    Electronically signed by:  Odis Mace D.CLEMENTEEN AMYE [employer] Fox 3:38 PM 01/16/24

## 2024-01-16 ENCOUNTER — Ambulatory Visit (INDEPENDENT_AMBULATORY_CARE_PROVIDER_SITE_OTHER): Admitting: Sports Medicine

## 2024-01-16 VITALS — HR 66 | Ht 70.0 in | Wt 189.0 lb

## 2024-01-16 DIAGNOSIS — M4316 Spondylolisthesis, lumbar region: Secondary | ICD-10-CM

## 2024-01-16 DIAGNOSIS — R9389 Abnormal findings on diagnostic imaging of other specified body structures: Secondary | ICD-10-CM | POA: Diagnosis not present

## 2024-01-16 DIAGNOSIS — G8929 Other chronic pain: Secondary | ICD-10-CM

## 2024-01-16 DIAGNOSIS — M545 Low back pain, unspecified: Secondary | ICD-10-CM | POA: Diagnosis not present

## 2024-01-16 NOTE — Patient Instructions (Signed)
 Tylenol [phone] mg 2-3 times a day for pain relief  - Use meloxicam  15 mg daily as needed for pain.  Recommend limiting chronic NSAIDs to 1-2 doses per week to prevent long-term side effects. If pain returns call and ask for a lumbar MRI As needed follow up

## 2024-01-17 ENCOUNTER — Other Ambulatory Visit: Payer: Self-pay | Admitting: Sports Medicine

## 2024-01-17 DIAGNOSIS — M9902 Segmental and somatic dysfunction of thoracic region: Secondary | ICD-10-CM

## 2024-01-17 DIAGNOSIS — M9905 Segmental and somatic dysfunction of pelvic region: Secondary | ICD-10-CM

## 2024-01-17 DIAGNOSIS — M4316 Spondylolisthesis, lumbar region: Secondary | ICD-10-CM

## 2024-01-17 DIAGNOSIS — M545 Low back pain, unspecified: Secondary | ICD-10-CM

## 2024-01-17 DIAGNOSIS — M9908 Segmental and somatic dysfunction of rib cage: Secondary | ICD-10-CM

## 2024-01-17 DIAGNOSIS — M9903 Segmental and somatic dysfunction of lumbar region: Secondary | ICD-10-CM

## 2024-01-17 DIAGNOSIS — M9904 Segmental and somatic dysfunction of sacral region: Secondary | ICD-10-CM

## 2024-01-23 ENCOUNTER — Encounter (HOSPITAL_BASED_OUTPATIENT_CLINIC_OR_DEPARTMENT_OTHER): Admitting: Physical Therapy

## 2024-01-28 ENCOUNTER — Other Ambulatory Visit: Payer: Self-pay | Admitting: Internal Medicine

## 2024-01-30 ENCOUNTER — Other Ambulatory Visit: Payer: Self-pay | Admitting: Internal Medicine

## 2024-02-16 ENCOUNTER — Ambulatory Visit (INDEPENDENT_AMBULATORY_CARE_PROVIDER_SITE_OTHER): Admitting: Physician Assistant

## 2024-02-16 ENCOUNTER — Encounter: Payer: Self-pay | Admitting: Physician Assistant

## 2024-02-16 VITALS — BP 92/62 | HR 58

## 2024-02-16 DIAGNOSIS — K219 Gastro-esophageal reflux disease without esophagitis: Secondary | ICD-10-CM | POA: Diagnosis not present

## 2024-02-16 MED ORDER — PANTOPRAZOLE SODIUM 20 MG PO TBEC: 20.0000 mg | DELAYED_RELEASE_TABLET | Freq: Every day | ORAL | 3 refills | Status: DC

## 2024-02-16 MED ORDER — PANTOPRAZOLE SODIUM 20 MG PO TBEC: 20.0000 mg | DELAYED_RELEASE_TABLET | Freq: Every day | ORAL | 3 refills | Status: AC

## 2024-02-16 NOTE — Progress Notes (Signed)
 Ellouise Console, PA-C [address] [place], [place]  [address] Phone: [phone]   Primary Care Physician: Katrinka Garnette KIDD, MD  Primary Gastroenterologist:  Ellouise Console, PA-C / Dr. Gordy Starch   Chief Complaint: Follow-up GERD, refill pantoprazole        HPI:   Matthew Fox is a 64 y.o. male presents for annual follow-up of acid reflux.  Currently taking pantoprazole  20 Mg once daily with good control of acid reflux.  Needs medication refill.  Tried weaning off pantoprazole  and had recurrent acid reflux.  On low-dose pantoprazole  20 once daily his reflux is controlled.  He wants to stay on medication.  He is doing well with no GI concerns today.  05/2017 colonoscopy: No polyps.  Mild diverticulosis and internal hemorrhoids.  10-year repeat.  04/2012 colonoscopy: Adenomatous colon polyp  No previous EGD.  Current Outpatient Medications  Medication Sig Dispense Refill   ezetimibe  (ZETIA ) 10 MG tablet TAKE 1 TABLET BY MOUTH EVERY DAY 90 tablet 3   fexofenadine (ALLEGRA) 180 MG tablet Take 180 mg by mouth daily.     meloxicam  (MOBIC ) 15 MG tablet Take 1 tablet (15 mg total) by mouth daily. (Patient taking differently: Take 15 mg by mouth as needed for pain.) 30 tablet 0   rosuvastatin  (CRESTOR ) 10 MG tablet TAKE 1 TABLET BY MOUTH EVERY DAY (Patient taking differently: Take 10 mg by mouth 2 (two) times a week.) 90 tablet 3   valsartan  (DIOVAN ) 160 MG tablet Take 1 tablet (160 mg total) by mouth daily. 90 tablet 3   pantoprazole  (PROTONIX ) 20 MG tablet Take 1 tablet (20 mg total) by mouth daily. 90 tablet 3   No current facility-administered medications for this visit.    Allergies as of 02/16/2024   (No Known Allergies)    Past Medical History:  Diagnosis Date   Adenomatous colon polyp    2013, 5 year repeat   Allergy    Coronary artery disease    Diverticulosis    HTN (hypertension)    Hyperlipidemia    no rx. total just above 200, HDL great, LDL in 120s  130s   Internal hemorrhoids    Leukopenia    stable in the 3s   Migraine    relpax  about once a month    Past Surgical History:  Procedure Laterality Date   ANAL FISSURE REPAIR  2007   COLONOSCOPY W/ POLYPECTOMY     HERNIA REPAIR     as infant; bilateral   right elbow surgery     VASECTOMY      Review of Systems:    All systems reviewed and negative except where noted in HPI.    Physical Exam:  BP 92/62 (BP Location: Left Arm, Patient Position: Sitting, Cuff Size: Normal)   Pulse (!) 58  No LMP for male patient.  General: Well-nourished, well-developed in no acute distress.  Neuro: Alert and oriented x 3.  Grossly intact.  Psych: Alert and cooperative, normal mood and affect.   Imaging Studies: No results found.  Labs: CBC    Component Value Date/Time   WBC 3.4 (L) 07/06/2023 0839   RBC 4.40 07/06/2023 0839   HGB 14.1 07/06/2023 0839   HCT 41.7 07/06/2023 0839   PLT 175.0 07/06/2023 0839   MCV 94.8 07/06/2023 0839   MCH 31.3 06/25/2020 0900   MCHC 33.8 07/06/2023 0839   RDW 13.1 07/06/2023 0839   LYMPHSABS 0.7 07/06/2023 0839   MONOABS 0.4 07/06/2023  0839   EOSABS 0.1 07/06/2023 0839   BASOSABS 0.0 07/06/2023 0839    CMP     Component Value Date/Time   NA 139 07/06/2023 0839   K 4.5 07/06/2023 0839   CL 103 07/06/2023 0839   CO2 31 07/06/2023 0839   GLUCOSE 98 07/06/2023 0839   BUN 17 07/06/2023 0839   CREATININE 1.05 07/06/2023 0839   CREATININE 0.90 06/25/2020 0900   CALCIUM  9.2 07/06/2023 0839   PROT 6.5 07/06/2023 0839   ALBUMIN 4.1 07/06/2023 0839   AST 24 07/06/2023 0839   ALT 21 07/06/2023 0839   ALKPHOS 49 07/06/2023 0839   BILITOT 0.6 07/06/2023 0839   GFRNONAA 94 06/25/2020 0900   GFRAA 109 06/25/2020 0900    Assessment and Plan:   Matthew Fox is a 63 y.o. y/o male returns for annual followup of GERD, manifested as Globus sensation and throat clearing, Controlled on low dose PPI.  GERD - Controlled - Continue  Pantoprazole  20mg  1 tablet once daily, #90, 3 RF. - Add OTC Pepcid or TUMs antiacid as needed for breakthrough GERD symptoms. - Recommend Lifestyle Modifications to prevent Acid Reflux.  Rec. Avoid coffee, sodas, peppermint, garlic, onions, alcohol, citrus fruits, chocolate, tomatoes, fatty and spicey foods.  Avoid eating 2-3 hours before bedtime.    Colon Cancer Screening - Is Up To Date. - 10 year repeat Colonoscopy will be due 05/2027.  Ellouise Console, PA-C  Follow up 1 year.

## 2024-02-16 NOTE — Patient Instructions (Addendum)
 Continue Pantoprazole  20mg  1 tablet once daily.  I refilled medication for 1 year. You may also take OTC Pepcid (famotidine) or antacid as needed for breakthrough heartburn.  Thank you for trusting me with your gastrointestinal care!   Ellouise Console, PA-C  _______________________________________________________  If your blood pressure at your visit was 140/90 or greater, please contact your primary care physician to follow up on this.  _______________________________________________________  If you are age 47 or older, your body mass index should be between 23-30. Your There is no height or weight on file to calculate BMI. If this is out of the aforementioned range listed, please consider follow up with your Primary Care Provider.  If you are age 60 or younger, your body mass index should be between 19-25. Your There is no height or weight on file to calculate BMI. If this is out of the aformentioned range listed, please consider follow up with your Primary Care Provider.   ________________________________________________________  The [facility] providers would like to encourage you to use MYCHART to communicate with providers for non-urgent requests or questions.  Due to long hold times on the telephone, sending your provider a message by [facility] may be a faster and more efficient way to get a response.  Please allow 48 business hours for a response.  Please remember that this is for non-urgent requests.  _______________________________________________________  [facility] is using a team-based approach to care.  Your team is made up of your doctor and two to three APPS. Our APPS (Nurse Practitioners and Physician Assistants) work with your physician to ensure care continuity for you. They are fully qualified to address your health concerns and develop a treatment plan. They communicate directly with your gastroenterologist to care for you. Seeing the [facility]  Practitioners on your physician's team can help you by facilitating care more promptly, often allowing for earlier appointments, access to diagnostic testing, procedures, and other specialty referrals.

## 2024-03-15 DIAGNOSIS — Q6211 Congenital occlusion of ureteropelvic junction: Secondary | ICD-10-CM | POA: Diagnosis not present

## 2024-04-09 ENCOUNTER — Other Ambulatory Visit: Payer: Self-pay | Admitting: Cardiovascular Disease

## 2024-05-04 ENCOUNTER — Other Ambulatory Visit: Payer: Self-pay | Admitting: Cardiovascular Disease

## 2024-05-08 ENCOUNTER — Other Ambulatory Visit: Payer: Self-pay | Admitting: Family Medicine

## 2024-06-03 ENCOUNTER — Encounter: Payer: Self-pay | Admitting: Family Medicine

## 2024-06-04 MED ORDER — EZETIMIBE 10 MG PO TABS: 10.0000 mg | ORAL_TABLET | Freq: Every day | ORAL | 3 refills | Status: AC

## 2024-06-20 DIAGNOSIS — D225 Melanocytic nevi of trunk: Secondary | ICD-10-CM | POA: Diagnosis not present

## 2024-06-20 DIAGNOSIS — L821 Other seborrheic keratosis: Secondary | ICD-10-CM | POA: Diagnosis not present

## 2024-06-20 DIAGNOSIS — D485 Neoplasm of uncertain behavior of skin: Secondary | ICD-10-CM | POA: Diagnosis not present

## 2024-06-20 DIAGNOSIS — D2271 Melanocytic nevi of right lower limb, including hip: Secondary | ICD-10-CM | POA: Diagnosis not present

## 2024-06-20 DIAGNOSIS — L578 Other skin changes due to chronic exposure to nonionizing radiation: Secondary | ICD-10-CM | POA: Diagnosis not present

## 2024-07-06 ENCOUNTER — Ambulatory Visit: Payer: BC Managed Care – PPO | Admitting: Family Medicine

## 2024-07-06 ENCOUNTER — Encounter: Payer: Self-pay | Admitting: Family Medicine

## 2024-07-06 ENCOUNTER — Ambulatory Visit: Payer: Self-pay | Admitting: Family Medicine

## 2024-07-06 VITALS — BP 132/84 | HR 63 | Temp 98.3°F | Ht 70.0 in | Wt 189.4 lb

## 2024-07-06 DIAGNOSIS — E663 Overweight: Secondary | ICD-10-CM | POA: Diagnosis not present

## 2024-07-06 DIAGNOSIS — Z125 Encounter for screening for malignant neoplasm of prostate: Secondary | ICD-10-CM | POA: Diagnosis not present

## 2024-07-06 DIAGNOSIS — E785 Hyperlipidemia, unspecified: Secondary | ICD-10-CM | POA: Diagnosis not present

## 2024-07-06 DIAGNOSIS — Z131 Encounter for screening for diabetes mellitus: Secondary | ICD-10-CM | POA: Diagnosis not present

## 2024-07-06 DIAGNOSIS — I712 Thoracic aortic aneurysm, without rupture, unspecified: Secondary | ICD-10-CM

## 2024-07-06 DIAGNOSIS — I10 Essential (primary) hypertension: Secondary | ICD-10-CM

## 2024-07-06 DIAGNOSIS — Z0000 Encounter for general adult medical examination without abnormal findings: Secondary | ICD-10-CM

## 2024-07-06 LAB — COMPREHENSIVE METABOLIC PANEL WITH GFR
ALT: 16 U/L (ref 3–53)
AST: 19 U/L (ref 5–37)
Albumin: 4.3 g/dL (ref 3.5–5.2)
Alkaline Phosphatase: 52 U/L (ref 39–117)
BUN: 20 mg/dL (ref 6–23)
CO2: 29 meq/L (ref 19–32)
Calcium: 9.4 mg/dL (ref 8.4–10.5)
Chloride: 102 meq/L (ref 96–112)
Creatinine, Ser: 1.12 mg/dL (ref 0.40–1.50)
GFR: 70.34 mL/min
Glucose, Bld: 94 mg/dL (ref 70–99)
Potassium: 4.4 meq/L (ref 3.5–5.1)
Sodium: 138 meq/L (ref 135–145)
Total Bilirubin: 0.7 mg/dL (ref 0.2–1.2)
Total Protein: 6.6 g/dL (ref 6.0–8.3)

## 2024-07-06 LAB — CBC WITH DIFFERENTIAL/PLATELET
Basophils Absolute: 0 K/uL (ref 0.0–0.1)
Basophils Relative: 1 % (ref 0.0–3.0)
Eosinophils Absolute: 0.2 K/uL (ref 0.0–0.7)
Eosinophils Relative: 5.3 % — ABNORMAL HIGH (ref 0.0–5.0)
HCT: 42.7 % (ref 39.0–52.0)
Hemoglobin: 14.4 g/dL (ref 13.0–17.0)
Lymphocytes Relative: 23.2 % (ref 12.0–46.0)
Lymphs Abs: 0.7 K/uL (ref 0.7–4.0)
MCHC: 33.6 g/dL (ref 30.0–36.0)
MCV: 92.3 fl (ref 78.0–100.0)
Monocytes Absolute: 0.3 K/uL (ref 0.1–1.0)
Monocytes Relative: 11.1 % (ref 3.0–12.0)
Neutro Abs: 1.8 K/uL (ref 1.4–7.7)
Neutrophils Relative %: 59.4 % (ref 43.0–77.0)
Platelets: 173 K/uL (ref 150.0–400.0)
RBC: 4.63 Mil/uL (ref 4.22–5.81)
RDW: 13 % (ref 11.5–15.5)
WBC: 3 K/uL — ABNORMAL LOW (ref 4.0–10.5)

## 2024-07-06 LAB — LIPID PANEL
Cholesterol: 158 mg/dL (ref 28–200)
HDL: 67.3 mg/dL
LDL Cholesterol: 83 mg/dL (ref 10–99)
NonHDL: 91.08
Total CHOL/HDL Ratio: 2
Triglycerides: 42 mg/dL (ref 10.0–149.0)
VLDL: 8.4 mg/dL (ref 0.0–40.0)

## 2024-07-06 LAB — HEMOGLOBIN A1C: Hgb A1c MFr Bld: 5.8 % (ref 4.6–6.5)

## 2024-07-06 LAB — PSA: PSA: 0.63 ng/mL (ref 0.10–4.00)

## 2024-07-06 NOTE — Progress Notes (Signed)
 " Phone: [phone]   Subjective:  Patient presents today for their annual physical. Chief complaint-noted.   See problem oriented charting- ROS- full  review of systems was completed and negative  except for topics noted under acute/chronic concerns  The following were reviewed and entered/updated in epic: Past Medical History:  Diagnosis Date   Adenomatous colon polyp    2013, 5 year repeat   Allergy    Coronary artery disease    Diverticulosis    HTN (hypertension)    Hyperlipidemia    no rx. total just above 200, HDL great, LDL in 120s 130s   Internal hemorrhoids    Leukopenia    stable in the 3s   Migraine    relpax  about once a month   Patient Active Problem List   Diagnosis Date Noted   Thoracic aortic aneurysm 09/04/2020    Priority: High   Aortic atherosclerosis 03/29/2021    Priority: Medium    BPPV (benign paroxysmal positional vertigo), left 12/28/2019    Priority: Medium    Essential hypertension 08/28/2019    Priority: Medium    Allergic rhinitis 06/16/2017    Priority: Medium    Migraine     Priority: Medium    Hyperlipidemia     Priority: Medium    Adenomatous colon polyp     Priority: Low   Leukopenia     Priority: Low   Medial epicondylitis of elbow, right 07/16/2019    Priority: 1.   Past Surgical History:  Procedure Laterality Date   ANAL FISSURE REPAIR  2007   COLONOSCOPY W/ POLYPECTOMY     HERNIA REPAIR     as infant; bilateral   right elbow surgery     VASECTOMY      Family History  Problem Relation Age of Onset   Lung cancer Mother        smoker, died 87 age 40   Heart failure Father        rheumatic fever-died 23 months after mother, did not take care of himself   Mitral valve prolapse Father    Colon cancer Maternal Grandfather        early 66s   Stomach cancer Neg Hx     Medications- reviewed and updated Current Outpatient Medications  Medication Sig Dispense Refill   ezetimibe  (ZETIA ) 10 MG tablet Take 1 tablet  (10 mg total) by mouth daily. 90 tablet 3   fexofenadine (ALLEGRA) 180 MG tablet Take 180 mg by mouth daily.     pantoprazole  (PROTONIX ) 20 MG tablet Take 1 tablet (20 mg total) by mouth daily. 90 tablet 3   rosuvastatin  (CRESTOR ) 10 MG tablet TAKE 1 TABLET BY MOUTH EVERY DAY 90 tablet 3   valsartan  (DIOVAN ) 160 MG tablet Take 1 tablet (160 mg total) by mouth daily. 90 tablet 3   No current facility-administered medications for this visit.    Allergies-reviewed and updated Allergies[1]  Social History   Social History Narrative   Married (wife patient of Dr. Katrinka), 2 daughters Harlene NP (in [place] with palliative care, had been in [place] with oncology) and Joesph  ([employer] grad going into insurance in [place] 2019) .   - 1 granddaughter 33 months old in 2023.          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    Prior Worked at  [employer]: running, biking, golf   -right handed   Objective  Objective:  BP 132/84   Pulse 63   Temp 98.3 F (36.8 C) (Temporal)   Ht 5' 10 (1.778 m)   Wt 189 lb 6.4 oz (85.9 kg)   SpO2 95%   BMI 27.18 kg/m  Gen: NAD, resting comfortably HEENT: Mucous membranes are moist. Oropharynx normal Neck: no thyromegaly CV: RRR no murmurs rubs or gallops Lungs: CTAB no crackles, wheeze, rhonchi Abdomen: soft/nontender/nondistended/normal bowel sounds. No rebound or guarding.  Ext: no edema Skin: warm, dry Neuro: grossly normal, moves all extremities, PERRLA   Assessment and Plan  63 y.o. male presenting for annual physical.  Health Maintenance counseling: 1. Anticipatory guidance: Patient counseled regarding regular dental exams -q6 months, eye exams -yearly,  avoiding smoking and second hand smoke, limiting alcohol to 2 beverages per day - 7 a week, no illicit drugs .   2. Risk factor  reduction:  Advised patient of need for regular exercise and diet rich and fruits and vegetables to reduce risk of heart attack and stroke.  Exercise-has maintained lower weight but high reps with exercise, some running one day a week- doesn't do well with his back- more elliptical.  Diet/weight management-weight within 2 pounds last year but reasonable muscle mass-okay to focus on maintenance-reports 184 on home scales Wt Readings from Last 3 Encounters:  07/06/24 189 lb 6.4 oz (85.9 kg)  01/16/24 189 lb (85.7 kg)  12/19/23 178 lb (80.7 kg)  3. Immunizations/screenings/ancillary studies-opts out of COVID and Prevnar 20 but otherwise up-to-date  Immunization History  Administered Date(s) Administered   Influenza Inj Mdck Quad Pf 04/25/2019, 05/03/2022   Influenza Split 04/10/2012, 03/28/2013   Influenza, Seasonal, Injecte, Preservative Fre 04/22/2023, 04/30/2024   Influenza,inj,Quad PF,6+ Mos 05/09/2015, 03/18/2016, 05/18/2018, 05/18/2019, 04/21/2021   Influenza-Unspecified 04/18/2013, 05/13/2014, 04/11/2017, 05/18/2018, 04/23/2020   Moderna Covid-19 Fall Seasonal Vaccine 55yrs & older 05/03/2022   PFIZER(Purple Top)SARS-COV-2 Vaccination 10/04/2019, 10/29/2019, 05/22/2020   Pfizer Covid-19 Vaccine Bivalent Booster 64yrs & up 06/21/2021   Td 07/28/2007   Tdap 06/19/2018   Zoster Recombinant(Shingrix ) 06/19/2018, 09/28/2018   4. Prostate cancer screening-  low risk prior trend- update psa today  Lab Results  Component Value Date   PSA 0.74 07/06/2023   PSA 0.54 07/01/2022   PSA 0.54 06/25/2020   5. Colon cancer screening -history of adenomatous polyps of the colon but none last colonoscopy 05/19/2017 with 10-year repeat recommended 6. Skin cancer screening-still sees Dr. Robinson 1-2 times a year and has required Mohs in the past. advised regular sunscreen use. Denies worrisome, changing, or new skin lesions.  7. Smoking associated screening (lung cancer screening, AAA screen 65-75, UA)-    smoker 8. STD screening -opts out as monogamous with wife  Status of chronic or acute concerns   # aortic aneurysm thoracic S: Incidental finding on imaging for coronary calcium  scoring at 42 mm in January 2022.  Stable at 40 mm September 2022. Repeat with Dr. Lucas 08/27/22.  -per Dr. Burton -40 mm on 03/08/23  Stable over 2-year.  We discussed this is likely normal for his age.  I have recommended no further testing.  Suspect this will not be an issue for him in life. -Cardiothoracic surgery has mention possible 1 year follow-up from 08/27/2022  A/P: cardiology has said 2 years- we think that's reasonable- reorder for now and will likely get don ein 2026 right around 2 years  -  Systolic blood pressure goal 105-1 20- see below - annual follow-up -avoid quiniolone antibiotics if possible  #hypertension-more aggressive blood pressure goal due to potential aneurysm S: medication: valsartan  160mg  -prior amlodipine  10 mg but bp lower Home readings #s:  140's to 150's at home BP Readings from Last 3 Encounters:  07/06/24 132/84  02/16/24 92/62  12/19/23 110/80  A/P: blood pressure higher than usual and has trended higher recently as well. Gave option of increasing valsartan  again or we could add amlodipine  back at lower dose. Hed like to monitor for now and maybe check against another cuff at home with prior readings here and other offices looking so good  #hyperlipidemia with coronary calcium  score of 68 but focus of calcium  in proximal LAD.  LDL before starting medication 136 S: Medication:Rosuvastatin  10 mg twice weekly, zetia  10 mg- cardiology ok with <100  Lab Results  Component Value Date   CHOL 156 07/06/2023   HDL 64.40 07/06/2023   LDLCALC 83 07/06/2023   LDLDIRECT 132.8 05/23/2013   TRIG 43.0 07/06/2023   CHOLHDL 2 07/06/2023   A/P: update Lipids today as long as LDL under 100 hold steady- he has enough supply for a while so no refill today.   #BPPV starting 2021- referred to  vestibular rehab in January 2022- reports was very helpful- no recent issues   # #Left hydronephrosis- dilated left renal pelvis- Dr. Watt NM renal imaging flow  - in note mentioned as congenital and 1 year follow up from 08/2021  -In February 2025 plan is for every 2-year imaging but every 6 months renal function testing  #GERD- has worked with Dr. Albertus- and able to get PPI down to 20 mg -   #back pain- sees Dr. Leonce in 2025 as needed- ongoing issues- mainly mornings and then does ok  Recommended follow up: Return in about 1 year (around 07/06/2025) for physical or sooner if needed.Schedule b4 you leave.  Lab/Order associations: fasting   ICD-10-CM   1. Preventative health care  Z00.00     2. Screening for prostate cancer  Z12.5     3. Essential hypertension  I10     4. Hyperlipidemia, unspecified hyperlipidemia type  E78.5     5. Thoracic aortic aneurysm without rupture, unspecified part  I71.20       No orders of the defined types were placed in this encounter.   Return precautions advised.  Garnette Lukes, MD      [1] No Known Allergies  "

## 2024-07-06 NOTE — Patient Instructions (Addendum)
 We will call you within two weeks about your referral for aneurysm recheck through [facility].  Their phone number is [phone].  Please call them if you have not heard in 1-2 weeks  Please stop by lab before you go If you have mychart- we will send your results within 3 business days of us  receiving them.  If you do not have mychart- we will call you about results within 5 business days of us  receiving them.  *please also note that you will see labs on mychart as soon as they post. I will later go in and write notes on them- will say notes from Dr. Katrinka   Recommended follow up: Return in about 1 year (around 07/06/2025) for physical or sooner if needed.Schedule b4 you leave.

## 2024-08-16 ENCOUNTER — Other Ambulatory Visit

## 2025-07-09 ENCOUNTER — Encounter: Admitting: Family Medicine
